# Patient Record
Sex: Female | Born: 1985 | Race: White | Hispanic: No | Marital: Married | Smoking: Current every day smoker
Health system: Southern US, Community
[De-identification: ages and names within clinical notes are randomized; demographics above are authoritative.]

## PROBLEM LIST (undated history)

## (undated) ENCOUNTER — Inpatient Hospital Stay: Payer: Self-pay

## (undated) DIAGNOSIS — F32A Depression, unspecified: Secondary | ICD-10-CM

## (undated) DIAGNOSIS — K219 Gastro-esophageal reflux disease without esophagitis: Secondary | ICD-10-CM

## (undated) DIAGNOSIS — N879 Dysplasia of cervix uteri, unspecified: Secondary | ICD-10-CM

## (undated) DIAGNOSIS — T8859XA Other complications of anesthesia, initial encounter: Secondary | ICD-10-CM

## (undated) DIAGNOSIS — T4145XA Adverse effect of unspecified anesthetic, initial encounter: Secondary | ICD-10-CM

## (undated) DIAGNOSIS — F329 Major depressive disorder, single episode, unspecified: Secondary | ICD-10-CM

## (undated) HISTORY — PX: BREAST ENHANCEMENT SURGERY: SHX7

## (undated) HISTORY — PX: LEEP: SHX91

## (undated) HISTORY — PX: DILATION AND CURETTAGE OF UTERUS: SHX78

---

## 2004-10-13 ENCOUNTER — Observation Stay: Payer: Self-pay

## 2004-10-22 ENCOUNTER — Observation Stay: Payer: Self-pay

## 2004-10-23 ENCOUNTER — Observation Stay: Payer: Self-pay

## 2004-12-21 ENCOUNTER — Inpatient Hospital Stay: Payer: Self-pay | Admitting: Unknown Physician Specialty

## 2006-01-23 ENCOUNTER — Emergency Department: Payer: Self-pay | Admitting: Emergency Medicine

## 2010-03-08 ENCOUNTER — Ambulatory Visit: Payer: Self-pay | Admitting: Family Medicine

## 2013-11-21 ENCOUNTER — Emergency Department: Payer: Self-pay | Admitting: Emergency Medicine

## 2013-11-21 LAB — CBC
HCT: 41.7 % (ref 35.0–47.0)
HGB: 14.2 g/dL (ref 12.0–16.0)
MCH: 34 pg (ref 26.0–34.0)
MCHC: 34 g/dL (ref 32.0–36.0)
MCV: 100 fL (ref 80–100)
Platelet: 301 10*3/uL (ref 150–440)
RBC: 4.17 10*6/uL (ref 3.80–5.20)
RDW: 12.8 % (ref 11.5–14.5)
WBC: 7.1 10*3/uL (ref 3.6–11.0)

## 2013-11-21 LAB — HCG, QUANTITATIVE, PREGNANCY: Beta Hcg, Quant.: 1122 m[IU]/mL — ABNORMAL HIGH

## 2014-06-10 NOTE — L&D Delivery Note (Signed)
VAGINAL DELIVERY NOTE:  Date of Delivery: 02/03/2015 Primary OB: [facility]  Gestational Age/EDD: [redacted]w[redacted]d -  3/87/5643 Antepartum complications: none Attending Physician: Barnett Applebaum, MD, FACOG Delivery Type: spontaneous vaginal delivery  Anesthesia: none Laceration: periurethral Episiotomy: none Placenta: spontaneous Intrapartum complications: None Estimated Blood Loss: 200 mL GBS: neg Procedure Details: Rapid labor after AROM.  No complications. Baby: Liveborn female, Apgars 8/9, weight 7 #, 4 oz  Barnett Applebaum, MD

## 2014-06-20 LAB — OB RESULTS CONSOLE VARICELLA ZOSTER ANTIBODY, IGG: Varicella: IMMUNE

## 2014-06-20 LAB — OB RESULTS CONSOLE ABO/RH: RH Type: POSITIVE

## 2014-06-20 LAB — OB RESULTS CONSOLE HEPATITIS B SURFACE ANTIGEN: HEP B S AG: NEGATIVE

## 2014-06-20 LAB — OB RESULTS CONSOLE RPR: RPR: NONREACTIVE

## 2014-06-20 LAB — OB RESULTS CONSOLE ANTIBODY SCREEN: Antibody Screen: NEGATIVE

## 2014-06-20 LAB — OB RESULTS CONSOLE HIV ANTIBODY (ROUTINE TESTING): HIV: NONREACTIVE

## 2014-10-04 ENCOUNTER — Ambulatory Visit (INDEPENDENT_AMBULATORY_CARE_PROVIDER_SITE_OTHER): Payer: BLUE CROSS/BLUE SHIELD | Admitting: General Surgery

## 2014-10-04 ENCOUNTER — Encounter: Payer: Self-pay | Admitting: General Surgery

## 2014-10-04 VITALS — BP 120/80 | HR 82 | Resp 14 | Ht 63.0 in | Wt 135.0 lb

## 2014-10-04 DIAGNOSIS — K645 Perianal venous thrombosis: Secondary | ICD-10-CM

## 2014-10-04 MED ORDER — HYDROCORTISONE ACE-PRAMOXINE 2.5-1 % RE CREA: 1.0000 "application " | TOPICAL_CREAM | Freq: Three times a day (TID) | RECTAL | Status: DC

## 2014-10-04 NOTE — Progress Notes (Signed)
Patient ID: Shannon Little, female   DOB: 1985/10/20, 29 y.o.   MRN: [id number]  Chief Complaint  Patient presents with  . Other    hemorrhoids    HPI Shannon Little is a 29 y.o. female here today for a evaluation of hemorrhoids. Patient first noticed the development of hemorrhoids approximately two weeks ago after a bout of constipation and diarrhea. Started having itching and pain yesterday afternoon, 2-3 hours after BM. Pain has worsened. Has tried soaking in the bath, ice, OTC hemorrhoid cream, and hemorrhoid wipes with no relief. Usually has a BM every day, not had one today due to fear of pain. Painful to sit. Denies any bleeding. No fever, abdominal pain, nausea, vomiting, or urinary symptoms.  Evaluated by PCP, believed to be 1-2 external hemorrhoids.  History of hemorrhoids with first pregnancy nine years ago. Hemorrhoids resolved on their own.  History of abnormal PAP smears. Leep procedure - patient unsure of the year, but not cancerous.   HPI  History reviewed. No pertinent past medical history.  History reviewed. No pertinent past surgical history.  History reviewed. No pertinent family history.  Social History History  Substance Use Topics  . Smoking status: Current Some Day Smoker  . Smokeless tobacco: Not on file  . Alcohol Use: No    No Known Allergies  Current Outpatient Prescriptions  Medication Sig Dispense Refill  . Prenatal Vit-Fe Fumarate-FA (PRENATAL MULTIVITAMIN) TABS tablet Take 1 tablet by mouth daily at 12 noon.    . hydrocortisone-pramoxine (ANALPRAM HC) 2.5-1 % rectal cream Place 1 application rectally 3 (three) times daily. 30 g 2   No current facility-administered medications for this visit.    Review of Systems Review of Systems  Constitutional: Negative.   Respiratory: Negative.   Cardiovascular: Negative.     Blood pressure 120/80, pulse 82, resp. rate 14, height 5\' 3"  (1.6 m), weight 61.236 kg (135 lb).  Physical Exam Physical  Exam  Constitutional: She is oriented to person, place, and time. She appears well-developed and well-nourished.  Eyes: Conjunctivae are normal. No scleral icterus.  Neck: Neck supple.  Genitourinary: Rectal exam shows external hemorrhoid.  Three external hemorrhoids 3, 7, and 8 o'clock. Anterior hemorrhoid at 8 o'clock thrombosed. All measure about 1-1.5cm.   Lymphadenopathy:    She has no cervical adenopathy.  Neurological: She is alert and oriented to person, place, and time.  Skin: Skin is warm and dry.    Data Reviewed none  Assessment    Inflamed and thrombosed external hemorrhoids.      Plan    With consent, the thrombosed hemorrhoid was opened and clots removed. 62ml 1% xyloicaine used with betadine prep.  RX with analpram cream 2.5% three times daily.  Follow up in two weeks.        SANKAR,SEEPLAPUTHUR G 10/04/2014, 3:52 PM

## 2014-10-04 NOTE — Patient Instructions (Addendum)
Use Analpram 2.5% three times daily. Follow up in two weeks.

## 2014-10-05 ENCOUNTER — Other Ambulatory Visit: Payer: Self-pay | Admitting: *Deleted

## 2014-10-05 ENCOUNTER — Telehealth: Payer: Self-pay | Admitting: *Deleted

## 2014-10-05 MED ORDER — HYDROCORTISONE ACE-PRAMOXINE 2.5-1 % EX CREA: TOPICAL_CREAM | CUTANEOUS | Status: DC

## 2014-10-05 NOTE — Telephone Encounter (Signed)
Patient called and Shannon Little was at [facility] on [address] trying to get her prescription for Analpram and Shannon Little said her insurance is not going to pay for it, so Shannon Little wants to know if Shannon Little can get something else sent to her pharmacy instead of the Analpram.

## 2014-10-18 ENCOUNTER — Ambulatory Visit (INDEPENDENT_AMBULATORY_CARE_PROVIDER_SITE_OTHER): Payer: BLUE CROSS/BLUE SHIELD | Admitting: General Surgery

## 2014-10-18 ENCOUNTER — Encounter: Payer: Self-pay | Admitting: General Surgery

## 2014-10-18 DIAGNOSIS — K648 Other hemorrhoids: Secondary | ICD-10-CM | POA: Diagnosis not present

## 2014-10-18 DIAGNOSIS — K644 Residual hemorrhoidal skin tags: Secondary | ICD-10-CM

## 2014-10-18 NOTE — Progress Notes (Signed)
Patient her for two week follow up for hemorrhoids. Patient reports she is feeling much better. She has been using maximum strength preparation H and that has been helping.  Patient did not use the Analpram her insurance did not approve and tried some of a friend's sample and irritates skin. No pain and no bleeding.  Left hemorrhoid still present, but reduced in size, no thrombosis.  Continue to use OTC cream as needed.  Follow up as needed.

## 2014-10-18 NOTE — Patient Instructions (Signed)
Continue to use OTC cream as needed. Follow up as needed.

## 2014-12-15 LAB — OB RESULTS CONSOLE GC/CHLAMYDIA
CHLAMYDIA, DNA PROBE: NEGATIVE
GC PROBE AMP, GENITAL: NEGATIVE

## 2014-12-15 LAB — OB RESULTS CONSOLE GBS: GBS: NEGATIVE

## 2015-01-24 ENCOUNTER — Observation Stay
Admission: EM | Admit: 2015-01-24 | Discharge: 2015-01-24 | Disposition: A | Discharge status: Home / Self Care | Payer: Medicaid Other | Attending: Obstetrics & Gynecology | Admitting: Obstetrics & Gynecology

## 2015-01-24 ENCOUNTER — Encounter: Payer: Self-pay | Admitting: *Deleted

## 2015-01-24 DIAGNOSIS — R109 Unspecified abdominal pain: Secondary | ICD-10-CM | POA: Diagnosis present

## 2015-01-24 DIAGNOSIS — Z3A39 39 weeks gestation of pregnancy: Secondary | ICD-10-CM | POA: Diagnosis not present

## 2015-01-24 DIAGNOSIS — O26893 Other specified pregnancy related conditions, third trimester: Secondary | ICD-10-CM | POA: Diagnosis not present

## 2015-01-24 DIAGNOSIS — Z3480 Encounter for supervision of other normal pregnancy, unspecified trimester: Secondary | ICD-10-CM

## 2015-01-24 HISTORY — DX: Dysplasia of cervix uteri, unspecified: N87.9

## 2015-01-24 MED ORDER — LACTATED RINGERS IV SOLN: 500.0000 mL | INTRAVENOUS | Status: DC | PRN

## 2015-01-24 NOTE — OB Triage Note (Signed)
Constant lower abdominal pain. Started @ 0200 this am. Awoke from sleep with pain. Membranes stripped in office yesterday. Shannon Little

## 2015-01-24 NOTE — H&P (Signed)
OB History & Physical   History of Present Illness:  Chief Complaint:   HPI:  Shannon Little is a 29 y.o. G105P1021 female with EDC=01/29/2015 at [redacted]w[redacted]d dated by LMP 04/25/2015 and 10wk6d ultrasound.  Her pregnancy has been complicated by smoking and a prior history of a LEEP..  She presents to L&D for evaluation of contractions. She denies LOF or bleeding. Contractions started at 0200 this AM. Rates her pains at 5/10. Was 2cm/80% yesterday and upon presentation this AM was 2/90%. Prenatal care site: Prenatal care at [facility] has been remarkable for early prenatal care, a normal anatomy scan, and normal first trimester and MSAFP testing. Was on Wellbutrin during her pregnancy and was able to stop smoking x1 month. She resumed smoking however. Total weight gain was 35#.      Maternal Medical History:   Past Medical History  Diagnosis Date  . Cervical dysplasia     Past Surgical History  Procedure Laterality Date  . Dilation and curettage of uterus    . Breast enhancement surgery    . Leep      No Known Allergies  Prior to Admission medications   Medication Sig Start Date End Date Taking? Authorizing Provider  Prenatal Vit-Fe Fumarate-FA (PRENATAL MULTIVITAMIN) TABS tablet Take 1 tablet by mouth daily at 12 noon.   Yes Historical Provider, MD  buPROPion (ZYBAN) 150 MG 12 hr tablet  09/17/14   Historical Provider, MD          Social History: She  reports that she has been smoking.  She has never used smokeless tobacco. She reports that she does not drink alcohol or use illicit drugs.  Family History: family history includes Cancer in her paternal grandmother.   Review of Systems: Negative x 10 systems reviewed except as noted in the HPI.      Physical Exam:  Vital Signs: BP 112/66 mmHg  Pulse 63  Temp(Src) 98.2 F (36.8 C) (Oral)  Resp 20  Ht 5\' 3"  (1.6 m)  Wt 154 lb (69.854 kg)  BMI 27.29 kg/m2 General: no acute distress.  HEENT: normocephalic,  atraumatic Heart: regular rate & rhythm.  No murmurs Lungs: clear to auscultation bilaterally Abdomen: soft, gravid, non-tender;  EFW: 7# Pelvic:   External: Normal external female genitalia  Cervix: 2.5 cm/ Effacement (%): 90 / Station: -1 with bloody show on glove  Extremities: non-tender, symmetric, trace edema bilaterally.  DTRs:+2 Neurologic: Alert & oriented x 3.    Pertinent Results:  Prenatal Labs: Blood type/Rh A positive  Antibody screen negative  Rubella Varicella Immune immune  RPR nonreactive  HBsAg negative  HIV nonreactive  GC negative  Chlamydia negative  Genetic screening Negative first trimester test/neg MSAFP  1 hour GTT 86  3 hour GTT NA  GBS negative on 01/02/15   Baseline FHR:130s with accelerations to 150s, moderate variability Toco: Contractions q2-4 min apart lasting 60 sec or longer    Assessment:  Shannon Little is a 29 y.o. G73P1021 female at [redacted]w[redacted]d in early vs latent labor FWB: Cat 1 strip  Plan:    1. Observe over the next couple hours and recheck cervix 2. GBS negative-PPX not indicated 3.    PO hydration  Jonathon Castelo  01/24/2015

## 2015-02-03 ENCOUNTER — Encounter: Payer: Self-pay | Admitting: *Deleted

## 2015-02-03 ENCOUNTER — Inpatient Hospital Stay
Admission: EM | Admit: 2015-02-03 | Discharge: 2015-02-04 | DRG: 983 | Disposition: A | Discharge status: Home / Self Care | Payer: Medicaid Other | Attending: Obstetrics & Gynecology | Admitting: Obstetrics & Gynecology

## 2015-02-03 DIAGNOSIS — Z3A40 40 weeks gestation of pregnancy: Secondary | ICD-10-CM | POA: Diagnosis present

## 2015-02-03 DIAGNOSIS — O480 Post-term pregnancy: Principal | ICD-10-CM | POA: Diagnosis present

## 2015-02-03 LAB — CBC
HCT: 37.5 % (ref 35.0–47.0)
Hemoglobin: 12.7 g/dL (ref 12.0–16.0)
MCH: 33.4 pg (ref 26.0–34.0)
MCHC: 33.8 g/dL (ref 32.0–36.0)
MCV: 98.7 fL (ref 80.0–100.0)
PLATELETS: 206 10*3/uL (ref 150–440)
RBC: 3.8 MIL/uL (ref 3.80–5.20)
RDW: 12.9 % (ref 11.5–14.5)
WBC: 14.2 10*3/uL — AB (ref 3.6–11.0)

## 2015-02-03 LAB — ABO/RH: ABO/RH(D): O POS

## 2015-02-03 LAB — TYPE AND SCREEN
ABO/RH(D): O POS
Antibody Screen: NEGATIVE

## 2015-02-03 MED ORDER — OXYTOCIN 40 UNITS IN LACTATED RINGERS INFUSION - SIMPLE MED
INTRAVENOUS | Status: AC
  Administered 2015-02-03: 999 mL/h via INTRAVENOUS
  Filled 2015-02-03: qty 1000

## 2015-02-03 MED ORDER — ONDANSETRON HCL 4 MG/2ML IJ SOLN: 4.0000 mg | INTRAMUSCULAR | Status: DC | PRN

## 2015-02-03 MED ORDER — SENNOSIDES-DOCUSATE SODIUM 8.6-50 MG PO TABS
2.0000 | ORAL_TABLET | ORAL | Status: DC
  Administered 2015-02-03: 2 via ORAL
  Filled 2015-02-03: qty 2

## 2015-02-03 MED ORDER — OXYCODONE-ACETAMINOPHEN 5-325 MG PO TABS
1.0000 | ORAL_TABLET | ORAL | Status: DC | PRN
  Administered 2015-02-03 – 2015-02-04 (×4): 1 via ORAL
  Filled 2015-02-03 (×3): qty 1

## 2015-02-03 MED ORDER — BENZOCAINE-MENTHOL 20-0.5 % EX AERO
1.0000 "application " | INHALATION_SPRAY | CUTANEOUS | Status: DC | PRN
  Filled 2015-02-03: qty 56

## 2015-02-03 MED ORDER — DIBUCAINE 1 % RE OINT
1.0000 "application " | TOPICAL_OINTMENT | RECTAL | Status: DC | PRN
  Filled 2015-02-03: qty 28

## 2015-02-03 MED ORDER — OXYCODONE-ACETAMINOPHEN 5-325 MG PO TABS
2.0000 | ORAL_TABLET | ORAL | Status: DC | PRN
  Filled 2015-02-03: qty 2

## 2015-02-03 MED ORDER — HYDROCORTISONE ACE-PRAMOXINE 1-1 % RE CREA
TOPICAL_CREAM | Freq: Four times a day (QID) | RECTAL | Status: DC | PRN
  Filled 2015-02-03: qty 30

## 2015-02-03 MED ORDER — ONDANSETRON HCL 4 MG/2ML IJ SOLN
4.0000 mg | Freq: Four times a day (QID) | INTRAMUSCULAR | Status: DC | PRN
Start: 2015-02-03 — End: 2015-02-03

## 2015-02-03 MED ORDER — ZOLPIDEM TARTRATE 5 MG PO TABS: 5.0000 mg | ORAL_TABLET | Freq: Every evening | ORAL | Status: DC | PRN

## 2015-02-03 MED ORDER — LACTATED RINGERS IV SOLN: 500.0000 mL | INTRAVENOUS | Status: DC | PRN

## 2015-02-03 MED ORDER — IBUPROFEN 600 MG PO TABS
ORAL_TABLET | ORAL | Status: AC
  Administered 2015-02-03: 600 mg via ORAL
  Filled 2015-02-03: qty 1

## 2015-02-03 MED ORDER — SIMETHICONE 80 MG PO CHEW: 80.0000 mg | CHEWABLE_TABLET | ORAL | Status: DC | PRN

## 2015-02-03 MED ORDER — OXYTOCIN BOLUS FROM INFUSION: 500.0000 mL | INTRAVENOUS | Status: DC

## 2015-02-03 MED ORDER — WITCH HAZEL-GLYCERIN EX PADS
1.0000 "application " | MEDICATED_PAD | CUTANEOUS | Status: DC | PRN
  Filled 2015-02-03: qty 100

## 2015-02-03 MED ORDER — BUTORPHANOL TARTRATE 1 MG/ML IJ SOLN
INTRAMUSCULAR | Status: AC
  Administered 2015-02-03: 1 mg via INTRAVENOUS
  Filled 2015-02-03: qty 1

## 2015-02-03 MED ORDER — OXYTOCIN 40 UNITS IN LACTATED RINGERS INFUSION - SIMPLE MED: 62.5000 mL/h | INTRAVENOUS | Status: DC | PRN

## 2015-02-03 MED ORDER — SODIUM CHLORIDE 0.9 % IJ SOLN
3.0000 mL | Freq: Two times a day (BID) | INTRAMUSCULAR | Status: DC
  Administered 2015-02-03: 3 mL via INTRAVENOUS

## 2015-02-03 MED ORDER — ONDANSETRON HCL 4 MG PO TABS: 4.0000 mg | ORAL_TABLET | ORAL | Status: DC | PRN

## 2015-02-03 MED ORDER — ACETAMINOPHEN 325 MG PO TABS: 650.0000 mg | ORAL_TABLET | ORAL | Status: DC | PRN

## 2015-02-03 MED ORDER — AMMONIA AROMATIC IN INHA
RESPIRATORY_TRACT | Status: AC
  Filled 2015-02-03: qty 10

## 2015-02-03 MED ORDER — CITRIC ACID-SODIUM CITRATE 334-500 MG/5ML PO SOLN: 30.0000 mL | ORAL | Status: DC

## 2015-02-03 MED ORDER — OXYTOCIN 10 UNIT/ML IJ SOLN
INTRAMUSCULAR | Status: AC
  Filled 2015-02-03: qty 2

## 2015-02-03 MED ORDER — SODIUM CHLORIDE 0.9 % IJ SOLN: 3.0000 mL | INTRAMUSCULAR | Status: DC | PRN

## 2015-02-03 MED ORDER — SODIUM CHLORIDE 0.9 % IV SOLN: 250.0000 mL | INTRAVENOUS | Status: DC | PRN

## 2015-02-03 MED ORDER — HYDROCORTISONE ACE-PRAMOXINE 1-1 % RE FOAM: 1.0000 | Freq: Four times a day (QID) | RECTAL | Status: DC | PRN

## 2015-02-03 MED ORDER — MISOPROSTOL 200 MCG PO TABS
ORAL_TABLET | ORAL | Status: AC
  Filled 2015-02-03: qty 4

## 2015-02-03 MED ORDER — DIPHENHYDRAMINE HCL 25 MG PO CAPS: 25.0000 mg | ORAL_CAPSULE | Freq: Four times a day (QID) | ORAL | Status: DC | PRN

## 2015-02-03 MED ORDER — LIDOCAINE HCL (PF) 1 % IJ SOLN
INTRAMUSCULAR | Status: AC
  Filled 2015-02-03: qty 30

## 2015-02-03 MED ORDER — BUTORPHANOL TARTRATE 1 MG/ML IJ SOLN
1.0000 mg | INTRAMUSCULAR | Status: DC | PRN
  Administered 2015-02-03: 1 mg via INTRAVENOUS

## 2015-02-03 MED ORDER — LACTATED RINGERS IV SOLN: INTRAVENOUS | Status: DC

## 2015-02-03 MED ORDER — TERBUTALINE SULFATE 1 MG/ML IJ SOLN: 0.2500 mg | Freq: Once | INTRAMUSCULAR | Status: DC | PRN

## 2015-02-03 MED ORDER — IBUPROFEN 600 MG PO TABS
600.0000 mg | ORAL_TABLET | Freq: Four times a day (QID) | ORAL | Status: DC
  Administered 2015-02-03 – 2015-02-04 (×4): 600 mg via ORAL
  Filled 2015-02-03 (×4): qty 1

## 2015-02-03 MED ORDER — LANOLIN HYDROUS EX OINT
TOPICAL_OINTMENT | CUTANEOUS | Status: DC | PRN
Start: 2015-02-03 — End: 2015-02-04

## 2015-02-03 MED ORDER — OXYTOCIN 40 UNITS IN LACTATED RINGERS INFUSION - SIMPLE MED
62.5000 mL/h | INTRAVENOUS | Status: DC
Start: 2015-02-03 — End: 2015-02-03
  Administered 2015-02-03: 999 mL/h via INTRAVENOUS
  Administered 2015-02-03: 62.5 mL/h via INTRAVENOUS

## 2015-02-03 NOTE — Lactation Note (Signed)
This note was copied from the chart of Shannon Beryle Bagsby. Lactation Consultation Note  Patient Name: Shannon Little [facility]'T Date: 02/03/2015 Reason for consult: Initial assessment   Maternal Data Does the patient have breastfeeding experience prior to this delivery?: No Pt attempted with 30 yr old, but did not try very long, has a breast augment with peri-areolar incision, milk never came in with 1st pregnancy, no leaking and unable to express milk from breast now, baby very fussy and roots but will not suck at breast at present, mom had Stadol in labor, mom encouraged to try again later  Feeding Feeding Type: Formula  LATCH Score/Interventions Latch: Too sleepy or reluctant, no latch achieved, no sucking elicited. Intervention(s): Skin to skin  Audible Swallowing: None  Type of Nipple: Everted at rest and after stimulation  Comfort (Breast/Nipple): Soft / non-tender     Hold (Positioning): Full assist, staff holds infant at breast  [facility] Score: 4  Lactation Tools Discussed/Used     Consult Status      Shannon Little 02/03/2015, 12:44 PM

## 2015-02-03 NOTE — Discharge Summary (Signed)
Obstetrical Discharge Summary  Date of Admission: 02/03/2015 Date of Discharge: 02/04/2015 Discharge Diagnosis: Term Pregnancy-delivered Primary OB:  [facility]   Gestational Age at Delivery: [redacted]w[redacted]d  Antepartum complications: none Date of Delivery: 02/03/15  Delivered By: Kenton Kingfisher Delivery Type: spontaneous vaginal delivery Intrapartum complications/course: None Anesthesia: none Placenta: spontaneous Laceration: periurethral Episiotomy: none Live born Female Birth Weight:  7-4 APGAR: 8, 9   Post partum course: Since the delivery, patient has tolerate activity, diet, and daily functions without difficulty or complication.  Min lochia.  No breast concerns at this time.  No signs of depression currently. BTLcanceled by patient  Postpartum Exam: General: comfortable, alert, in NAD, desires to be discharged. Tolerating regular diet and voiding without difficulty. C/o some hemorrhoidal pain. Wants to resume Wellbutrin to quit smoking. FF at U-3/ ML/ NT Lochia: appropriate Ext: No calf erythema or tenderness  Hct: 37%  Disposition: home with infant Rh Immune globulin given: not applicable Rubella vaccine given: not applicable Varicella vaccine given: not applicable Tdap vaccine given in AP or PP setting: given during prenatal care Flu vaccine given in AP or PP setting: not applicable Contraception: Depo-given script-to bring to pp visit  Prenatal Labs:O POS //Varicella Immune//Rubella Immune//RPR negative//HIV negative/HepB Surface Ag negative//plans to breastfeed  Plan:  Shannon Little was discharged to home in good condition. Follow-up appointment with [facility] provider in 6 weeks  Discharge Medications:   Medication List    TAKE these medications        benzocaine-Menthol 20-0.5 % Aero  Commonly known as:  DERMOPLAST  Apply 1 application topically as needed for irritation (perineal discomfort).     buPROPion 150 MG 12 hr tablet  Commonly known as:  ZYBAN     hydrocortisone-pramoxine 2.5-1 % rectal cream  Commonly known as:  ANALPRAM HC  Place 1 application rectally 4 (four) times daily as needed for hemorrhoids or itching.     ibuprofen 600 MG tablet  Commonly known as:  ADVIL,MOTRIN  Take 1 tablet (600 mg total) by mouth every 6 (six) hours as needed for mild pain or cramping.     lanolin Oint  Apply 1 application topically as needed (for breast care).     oxyCODONE-acetaminophen 5-325 MG per tablet  Commonly known as:  PERCOCET/ROXICET  Take 1 tablet by mouth every 4 (four) hours as needed (for pain scale 4-7).     prenatal multivitamin Tabs tablet  Take 1 tablet by mouth daily at 12 noon.        Follow-up arrangements:  6 weeks at [facility]   No future appointments.

## 2015-02-03 NOTE — Lactation Note (Signed)
This note was copied from the chart of Shannon Little. Lactation Consultation Note  Patient Name: Shannon Little OLIDC'V Date: 02/03/2015 Reason for consult: Follow-up assessment   Maternal Data  Mother has decided to formula feed, counseled but wishes to continue with formula  Feeding Feeding Type: Bottle Fed - Formula  LATCH Score/Interventions                      Lactation Tools Discussed/Used     Consult Status Consult Status: Complete (pt has decided to formula feed)    Ferol Luz 02/03/2015, 5:07 PM

## 2015-02-03 NOTE — H&P (Signed)
History and Physical Interval Note:  02/03/2015 8:49 AM  Shannon Little  has presented today for induction of labor.  See H&P.   The various methods of treatment have been discussed with the patient and family. After consideration of risks, benefits and other options for treatment, the patient has consented to  Induction.  Plans also postpartum tubal for contraception.  The patient's history has been reviewed, patient examined, no change in status, stable for labor. I have reviewed the patient's chart and labs.  Questions were answered to the patient's satisfaction.    HARRIS,ROBERT Eddie Dibbles

## 2015-02-04 ENCOUNTER — Encounter
Admission: EM | Disposition: A | Discharge status: Home / Self Care | Payer: Self-pay | Source: Home / Self Care | Attending: Obstetrics & Gynecology

## 2015-02-04 LAB — CBC
HEMATOCRIT: 37 % (ref 35.0–47.0)
Hemoglobin: 12.3 g/dL (ref 12.0–16.0)
MCH: 32.7 pg (ref 26.0–34.0)
MCHC: 33.2 g/dL (ref 32.0–36.0)
MCV: 98.4 fL (ref 80.0–100.0)
Platelets: 206 10*3/uL (ref 150–440)
RBC: 3.76 MIL/uL — ABNORMAL LOW (ref 3.80–5.20)
RDW: 13.1 % (ref 11.5–14.5)
WBC: 12.6 10*3/uL — ABNORMAL HIGH (ref 3.6–11.0)

## 2015-02-04 LAB — RPR: RPR: NONREACTIVE

## 2015-02-04 SURGERY — LIGATION, FALLOPIAN TUBE, POSTPARTUM
Anesthesia: Choice | Laterality: Bilateral

## 2015-02-04 MED ORDER — IBUPROFEN 600 MG PO TABS: 600.0000 mg | ORAL_TABLET | Freq: Four times a day (QID) | ORAL | Status: DC | PRN

## 2015-02-04 MED ORDER — HYDROCORTISONE ACE-PRAMOXINE 2.5-1 % RE CREA: 1.0000 "application " | TOPICAL_CREAM | Freq: Four times a day (QID) | RECTAL | Status: DC | PRN

## 2015-02-04 MED ORDER — LANOLIN HYDROUS EX OINT: 1.0000 "application " | TOPICAL_OINTMENT | CUTANEOUS | Status: DC | PRN

## 2015-02-04 MED ORDER — OXYCODONE-ACETAMINOPHEN 5-325 MG PO TABS: 1.0000 | ORAL_TABLET | ORAL | Status: DC | PRN

## 2015-02-04 MED ORDER — BENZOCAINE-MENTHOL 20-0.5 % EX AERO: 1.0000 "application " | INHALATION_SPRAY | CUTANEOUS | Status: DC | PRN

## 2015-02-04 NOTE — Discharge Instructions (Signed)
Vaginal Delivery, Care After Refer to this sheet in the next few weeks. These discharge instructions provide you with information on caring for yourself after delivery. Your caregiver may also give you specific instructions. Your treatment has been planned according to the most current medical practices available, but problems sometimes occur. Call your caregiver if you have any problems or questions after you go home. HOME CARE INSTRUCTIONS  Take over-the-counter or prescription medicines only as directed by your caregiver or pharmacist.  Do not drink alcohol, especially if you are breastfeeding or taking medicine to relieve pain.  Do not chew or smoke tobacco around the baby. Take the Wellbutrin for 2 weeks before quit date.  Do not use illegal drugs.  Continue to use good perineal care. Good perineal care includes:  Wiping your perineum from front to back.  Keeping your perineum clean.  Do not use tampons, douche, or have sexual intercourse for the next 6 weeks.   Showers only, no tub baths for the next 6 weeks.   Wear a well-fitting bra that provides breast support.  Eat healthy foods.  Drink enough fluids to keep your urine clear or pale yellow.  Eat high-fiber foods such as whole grain cereals and breads, brown rice, beans, and fresh fruits and vegetables every day. These foods may help prevent or relieve constipation.  Follow your caregiver's recommendations regarding resumption of activities such as climbing stairs, driving, lifting, exercising, or traveling.  Try to have someone help you with your household activities and your newborn for at least a few days after you leave the hospital.  Rest as much as possible. Try to rest or take a nap when your newborn is sleeping.  Increase your activities gradually.  Keep all of your scheduled postpartum appointments. It is very important to keep your scheduled follow-up appointments. At these appointments, your caregiver will be  checking to make sure that you are healing physically and emotionally. SEEK MEDICAL CARE IF:   You are passing large clots from your vagina. Save any clots to show your caregiver.  You have a foul smelling discharge from your vagina.  You have trouble urinating.  You are urinating frequently.  You have pain when you urinate.  You have a change in your bowel movements.  You have increasing redness, pain, or swelling near your vaginal incision (episiotomy) or vaginal tear.  You have pus draining from your episiotomy or vaginal tear.  Your episiotomy or vaginal tear is separating.  You have painful, hard, or reddened breasts.  You have a severe headache.  You have blurred vision or see spots.  You feel sad or depressed.  You have thoughts of hurting yourself or your newborn.  You have questions about your care, the care of your newborn, or medicines.  You are dizzy or light-headed.  You have a rash.  You have nausea or vomiting.  You were breastfeeding and have not had a menstrual period within 12 weeks after you stopped breastfeeding.  You are not breastfeeding and have not had a menstrual period by the 12th week after delivery.  You have a fever. SEEK IMMEDIATE MEDICAL CARE IF:   You have persistent pain.  You have chest pain.  You have shortness of breath.  You faint.  You have leg pain.  You have stomach pain.  Your vaginal bleeding saturates two or more sanitary pads in 1 hour. MAKE SURE YOU:   Understand these instructions.  Will watch your condition.  Will get help right  away if you are not doing well or get worse.  This information is not intended to replace advice given to you by your health care provider. Make sure you discuss any questions you have with your health care provider.

## 2015-02-04 NOTE — Progress Notes (Signed)
Patient discharged home with infant. Vital signs stable, bleeding within normal limits, uterus firm. Discharge instructions, prescriptions, and follow up appointment given to and reviewed with patient. Patient verbalized understanding, all questions answered. Escorted in wheelchair by nursing.

## 2015-02-21 NOTE — Progress Notes (Signed)
L&D Triage Note  S: Contractions have spaced out, not as strong O: FHR: 135 with accels to 160, moderate variability Toco: irregular contractions Cervix: no change over 4-5 hours  A: Prodromal labor/ False labor  P: DC home with labor precations FU as scheduled at the office.  Dalia Heading, CNM

## 2015-04-07 LAB — HM PAP SMEAR: HM Pap smear: NEGATIVE

## 2016-06-10 NOTE — L&D Delivery Note (Signed)
Delivery Note At 6:19 AM a viable female was delivered via  (Presentation: OA).  APGAR: 8, 9; weight 5 lb 7.8 oz (2490 g).   Placenta status: spontaneous, intact.  Cord:3VC  with the following complications: precipitous delivert.  Cord pH: N/A  Anesthesia:  none Episiotomy:  none Lacerations:  none Suture Repair: none Est. Blood Loss (mL):  237mL  Mom to postpartum.  Baby to Couplet care / Skin to Skin.  Shannon Little 10/31/2016, 6:47 PM

## 2016-07-04 ENCOUNTER — Emergency Department
Admission: EM | Admit: 2016-07-04 | Discharge: 2016-07-04 | Disposition: A | Discharge status: Home / Self Care | Payer: BLUE CROSS/BLUE SHIELD | Attending: Emergency Medicine | Admitting: Emergency Medicine

## 2016-07-04 ENCOUNTER — Encounter: Payer: Self-pay | Admitting: Emergency Medicine

## 2016-07-04 ENCOUNTER — Emergency Department: Payer: BLUE CROSS/BLUE SHIELD

## 2016-07-04 DIAGNOSIS — O26899 Other specified pregnancy related conditions, unspecified trimester: Secondary | ICD-10-CM

## 2016-07-04 DIAGNOSIS — Z3A21 21 weeks gestation of pregnancy: Secondary | ICD-10-CM | POA: Insufficient documentation

## 2016-07-04 DIAGNOSIS — R102 Pelvic and perineal pain: Secondary | ICD-10-CM | POA: Diagnosis not present

## 2016-07-04 DIAGNOSIS — R109 Unspecified abdominal pain: Secondary | ICD-10-CM

## 2016-07-04 DIAGNOSIS — Z3492 Encounter for supervision of normal pregnancy, unspecified, second trimester: Secondary | ICD-10-CM

## 2016-07-04 DIAGNOSIS — R1032 Left lower quadrant pain: Secondary | ICD-10-CM | POA: Diagnosis not present

## 2016-07-04 DIAGNOSIS — F17200 Nicotine dependence, unspecified, uncomplicated: Secondary | ICD-10-CM | POA: Insufficient documentation

## 2016-07-04 DIAGNOSIS — O99332 Smoking (tobacco) complicating pregnancy, second trimester: Secondary | ICD-10-CM | POA: Diagnosis not present

## 2016-07-04 DIAGNOSIS — O26892 Other specified pregnancy related conditions, second trimester: Secondary | ICD-10-CM | POA: Insufficient documentation

## 2016-07-04 LAB — CBC WITH DIFFERENTIAL/PLATELET
BASOS ABS: 0.1 10*3/uL (ref 0–0.1)
Basophils Relative: 1 %
EOS PCT: 3 %
Eosinophils Absolute: 0.3 10*3/uL (ref 0–0.7)
HEMATOCRIT: 35.5 % (ref 35.0–47.0)
HEMOGLOBIN: 12.5 g/dL (ref 12.0–16.0)
LYMPHS PCT: 24 %
Lymphs Abs: 2.6 10*3/uL (ref 1.0–3.6)
MCH: 34.3 pg — AB (ref 26.0–34.0)
MCHC: 35.1 g/dL (ref 32.0–36.0)
MCV: 97.7 fL (ref 80.0–100.0)
Monocytes Absolute: 0.6 10*3/uL (ref 0.2–0.9)
Monocytes Relative: 6 %
NEUTROS ABS: 7 10*3/uL — AB (ref 1.4–6.5)
Neutrophils Relative %: 66 %
Platelets: 396 10*3/uL (ref 150–440)
RBC: 3.64 MIL/uL — AB (ref 3.80–5.20)
RDW: 13.1 % (ref 11.5–14.5)
WBC: 10.6 10*3/uL (ref 3.6–11.0)

## 2016-07-04 LAB — URINALYSIS, COMPLETE (UACMP) WITH MICROSCOPIC
BACTERIA UA: NONE SEEN
Bilirubin Urine: NEGATIVE
Glucose, UA: NEGATIVE mg/dL
HGB URINE DIPSTICK: NEGATIVE
Ketones, ur: 5 mg/dL — AB
Leukocytes, UA: NEGATIVE
NITRITE: NEGATIVE
PROTEIN: NEGATIVE mg/dL
SPECIFIC GRAVITY, URINE: 1.018 (ref 1.005–1.030)
pH: 6 (ref 5.0–8.0)

## 2016-07-04 LAB — BASIC METABOLIC PANEL
Anion gap: 5 (ref 5–15)
CALCIUM: 8.2 mg/dL — AB (ref 8.9–10.3)
CHLORIDE: 107 mmol/L (ref 101–111)
CO2: 24 mmol/L (ref 22–32)
CREATININE: 0.51 mg/dL (ref 0.44–1.00)
GFR calc non Af Amer: >60 mL/min (ref >60)
Glucose, Bld: 89 mg/dL (ref 65–99)
Potassium: 3.6 mmol/L (ref 3.5–5.1)
SODIUM: 136 mmol/L (ref 135–145)

## 2016-07-04 LAB — ABO/RH: ABO/RH(D): O POS

## 2016-07-04 LAB — HCG, QUANTITATIVE, PREGNANCY: hCG, Beta Chain, Quant, S: 19423 m[IU]/mL — ABNORMAL HIGH (ref <5)

## 2016-07-04 NOTE — ED Notes (Signed)
Pt states abd pain and increase in discharge, states she believes is 5 months pregnant but has not had any prenatal care, states she can feel the baby kicking, 3rd child, denies any vaginal bleeding, awake and alert in no acute distress

## 2016-07-04 NOTE — Discharge Instructions (Signed)
Your workup was reassuring today with no sign of infection or lab abnormalities.  Your ultrasound showed a single normal pregnancy at 21 weeks, 1 day gestation.  Please continue to take prenatal vitamins and follow-up with an OB provider of your choice at the next available opportunity.   Return to the emergency department if you develop new or worsening symptoms that concern you.

## 2016-07-04 NOTE — ED Triage Notes (Signed)
Patient to ER for c/o left pelvic pain. Patient states pain is sharp in nature, feels like pain she felt with previous miscarriage. Patient is currently pregnant, believes she is approx 5 months pregnant, but has not received any prenatal care. Has appointment with [facility] for 2/13 for first visit. Patient reports heavy vaginal discharge that is normal white color. Denies any vaginal bleeding or fluid leaking. Denies any fevers or diarrhea. Denies urinary symptoms. States pains do not feel like contractions.

## 2016-07-04 NOTE — ED Provider Notes (Signed)
[facility] Emergency Department Provider Note  ____________________________________________   None    (approximate)  I have reviewed the triage vital signs and the nursing notes.   HISTORY  Chief Complaint Abdominal Pain    HPI Shannon Little is a 31 y.o. female G5 P2 Ab2 at an estimated 5 months gestation by dates who has not had any prenatal care who presents for evaluation of abdominal pain.  She states that she has intermittent left-sided sharp pelvic pain and she is concerned because it feels like the pain she had with a previous miscarriage. She describes them pain as moderate in intensity but intermittent.  She has had no vaginal bleeding and normal whitish vaginal discharge.  She denies dysuria.  She also denies fever/chills, chest pain, shortness of breath, nausea, vomiting,  Diarrhea, constipation.  She has not yet followed up with OB because she did not have insurance but her insurance is now establishing she has an appointment with [facility] within the next couple of weeks.    Past Medical History:  Diagnosis Date  . Cervical dysplasia     Patient Active Problem List   Diagnosis Date Noted  . Post-dates pregnancy 02/03/2015  . Postpartum care following vaginal delivery 02/03/2015  . Pregnancy 01/24/2015  . Indication for care in labor or delivery 01/24/2015    Past Surgical History:  Procedure Laterality Date  . BREAST ENHANCEMENT SURGERY    . DILATION AND CURETTAGE OF UTERUS    . LEEP      Prior to Admission medications   Medication Sig Start Date End Date Taking? Authorizing Provider  benzocaine-Menthol (DERMOPLAST) 20-0.5 % AERO Apply 1 application topically as needed for irritation (perineal discomfort). 02/04/15   Dalia Heading, CNM  buPROPion (ZYBAN) 150 MG 12 hr tablet  09/17/14   Historical Provider, MD  hydrocortisone-pramoxine (ANALPRAM HC) 2.5-1 % rectal cream Place 1 application rectally 4 (four) times daily as needed  for hemorrhoids or itching. 02/04/15   Dalia Heading, CNM  ibuprofen (ADVIL,MOTRIN) 600 MG tablet Take 1 tablet (600 mg total) by mouth every 6 (six) hours as needed for mild pain or cramping. 02/04/15   Dalia Heading, CNM  lanolin OINT Apply 1 application topically as needed (for breast care). 02/04/15   Dalia Heading, CNM  oxyCODONE-acetaminophen (PERCOCET/ROXICET) 5-325 MG per tablet Take 1 tablet by mouth every 4 (four) hours as needed (for pain scale 4-7). 02/04/15   Dalia Heading, CNM  Prenatal Vit-Fe Fumarate-FA (PRENATAL MULTIVITAMIN) TABS tablet Take 1 tablet by mouth daily at 12 noon.    Historical Provider, MD    Allergies Patient has no known allergies.  Family History  Problem Relation Age of Onset  . Cancer Paternal Grandmother     ovarian    Social History Social History  Substance Use Topics  . Smoking status: Current Every Day Smoker    Packs/day: 1.00    Years: 13.00  . Smokeless tobacco: Never Used  . Alcohol use No    Review of Systems Constitutional: No fever/chills Eyes: No visual changes. ENT: No sore throat. Cardiovascular: Denies chest pain. Respiratory: Denies shortness of breath. Gastrointestinal: Intermittent left-sided pelvic/abdominal pain.  No nausea, no vomiting.  No diarrhea.  No constipation. Genitourinary: Negative for dysuria.  No vaginal bleeding Musculoskeletal: Negative for back pain. Skin: Negative for rash. Neurological: Negative for headaches, focal weakness or numbness.  10-point ROS otherwise negative.  ____________________________________________   PHYSICAL EXAM:  VITAL SIGNS: ED Triage Vitals  Enc Vitals Group  BP 07/04/16 1448 120/69     Pulse Rate 07/04/16 1448 93     Resp 07/04/16 1448 18     Temp 07/04/16 1448 98 F (36.7 C)     Temp Source 07/04/16 1448 Oral     SpO2 07/04/16 1448 99 %     Weight 07/04/16 1449 120 lb (54.4 kg)     Height 07/04/16 1449 5\' 3"  (1.6 m)     Head Circumference --       Peak Flow --      Pain Score 07/04/16 1448 2     Pain Loc --      Pain Edu? --      Excl. in [place]? --     Constitutional: Alert and oriented. Well appearing and in no acute distress. Eyes: Conjunctivae are normal. PERRL. EOMI. Head: Atraumatic. Nose: No congestion/rhinnorhea. Mouth/Throat: Mucous membranes are moist.  Oropharynx non-erythematous. Neck: No stridor.  No meningeal signs.   Cardiovascular: Normal rate, regular rhythm. Good peripheral circulation. Grossly normal heart sounds. Respiratory: Normal respiratory effort.  No retractions. Lungs CTAB. Gastrointestinal: Soft and nontender.  Gravid uterus consistent with dates.  No distention.  Genitourinary: Deferred due to second trimester pregnancy and no indication for urgent examination such as vaginal bleeding Musculoskeletal: No lower extremity tenderness nor edema. No gross deformities of extremities. Neurologic:  Normal speech and language. No gross focal neurologic deficits are appreciated.  Skin:  Skin is warm, dry and intact. No rash noted. Psychiatric: Mood and affect are normal. Speech and behavior are normal.  ____________________________________________   LABS (all labs ordered are listed, but only abnormal results are displayed)  Labs Reviewed  HCG, QUANTITATIVE, PREGNANCY - Abnormal; Notable for the following:       Result Value   hCG, Beta Chain, Quant, S [id number] (*)    All other components within normal limits  CBC WITH DIFFERENTIAL/PLATELET - Abnormal; Notable for the following:    RBC 3.64 (*)    MCH 34.3 (*)    Neutro Abs 7.0 (*)    All other components within normal limits  BASIC METABOLIC PANEL - Abnormal; Notable for the following:    BUN <5 (*)    Calcium 8.2 (*)    All other components within normal limits  URINALYSIS, COMPLETE (UACMP) WITH MICROSCOPIC - Abnormal; Notable for the following:    Color, Urine YELLOW (*)    APPearance CLEAR (*)    Ketones, ur 5 (*)    Squamous Epithelial / LPF 0-5  (*)    All other components within normal limits  ABO/RH   ____________________________________________  EKG  None - EKG not ordered by ED physician ____________________________________________  RADIOLOGY   US Ob Limited > 14 Wks  Result Date: 07/04/2016 CLINICAL DATA:  Abdominal pain and left lower quadrant pain over the last 2-3 days. EXAM: LIMITED OBSTETRIC ULTRASOUND FINDINGS: Number of Fetuses: 1 Heart Rate:  147 bpm Movement: Present Presentation: Cephalic Placental Location: Anterior Previa: No.  No abnormal finding on this limited exam. Amniotic Fluid (Subjective):  Within normal limits. BPD:  5.0cm 21w  1d MATERNAL FINDINGS: Cervix:  Appears closed. Uterus/Adnexae:  No abnormality visualized. IMPRESSION: Single living intrauterine pregnancy at 21 weeks 1 day. No abnormal finding on this limited exam. This exam is performed on an emergent basis and does not comprehensively evaluate fetal size, dating, or anatomy; follow-up complete OB US should be considered if further fetal assessment is warranted. Electronically Signed   By: Jan Fireman.D.  On: 07/04/2016 18:07    ____________________________________________   PROCEDURES  Procedure(s) performed:   Procedures   Critical Care performed: No ____________________________________________   INITIAL IMPRESSION / ASSESSMENT AND PLAN / ED COURSE  Pertinent labs & imaging results that were available during my care of the patient were reviewed by me and considered in my medical decision making (see chart for details).  Well-appearing, no acute distress, normal vital signs.  She states she just needed reassurance because of her prior to miscarriages.  She states she is taking prenatal vitamins and will continue to do so until she follows up with OB.  I explained that I am going to defer the pelvic exam because she is in her second trimester which increases the risk of the exam and it is not necessary at this time.  She states  she is frustrated because she was not able to get appointment right away with [facility] after she called them, so at her request I provided additional contact information but she does plan on continuing with [facility] at this time.    I gave my usual and customary return precautions.         ____________________________________________  FINAL CLINICAL IMPRESSION(S) / ED DIAGNOSES  Final diagnoses:  Abdominal pain during pregnancy  Second trimester pregnancy     MEDICATIONS GIVEN DURING THIS VISIT:  Medications - No data to display   NEW OUTPATIENT MEDICATIONS STARTED DURING THIS VISIT:  New Prescriptions   No medications on file    Modified Medications   No medications on file    Discontinued Medications   No medications on file     Note:  This document was prepared using Dragon voice recognition software and may include unintentional dictation errors.    Hinda Kehr, MD 07/04/16 [phone]

## 2016-07-23 DIAGNOSIS — O0932 Supervision of pregnancy with insufficient antenatal care, second trimester: Secondary | ICD-10-CM | POA: Diagnosis not present

## 2016-07-23 DIAGNOSIS — Z3492 Encounter for supervision of normal pregnancy, unspecified, second trimester: Secondary | ICD-10-CM | POA: Diagnosis not present

## 2016-07-23 LAB — OB RESULTS CONSOLE HGB/HCT, BLOOD
HCT: 35 %
HEMOGLOBIN: 12.1 g/dL

## 2016-07-23 LAB — OB RESULTS CONSOLE ANTIBODY SCREEN: Antibody Screen: NEGATIVE

## 2016-07-23 LAB — OB RESULTS CONSOLE RUBELLA ANTIBODY, IGM: RUBELLA: IMMUNE

## 2016-07-23 LAB — OB RESULTS CONSOLE HEPATITIS B SURFACE ANTIGEN: Hepatitis B Surface Ag: NEGATIVE

## 2016-07-23 LAB — OB RESULTS CONSOLE HIV ANTIBODY (ROUTINE TESTING): HIV: NONREACTIVE

## 2016-07-23 LAB — OB RESULTS CONSOLE PLATELET COUNT: Platelets: 362 10*3/uL

## 2016-07-23 LAB — OB RESULTS CONSOLE GC/CHLAMYDIA
Chlamydia: NEGATIVE
Gonorrhea: NEGATIVE

## 2016-07-23 LAB — OB RESULTS CONSOLE ABO/RH: RH TYPE: POSITIVE

## 2016-07-23 LAB — OB RESULTS CONSOLE RPR: RPR: NONREACTIVE

## 2016-07-23 LAB — OB RESULTS CONSOLE VARICELLA ZOSTER ANTIBODY, IGG: Varicella: IMMUNE

## 2016-08-23 ENCOUNTER — Encounter: Payer: Self-pay | Admitting: Advanced Practice Midwife

## 2016-08-23 ENCOUNTER — Other Ambulatory Visit: Payer: BLUE CROSS/BLUE SHIELD

## 2016-08-23 ENCOUNTER — Ambulatory Visit (INDEPENDENT_AMBULATORY_CARE_PROVIDER_SITE_OTHER): Payer: BLUE CROSS/BLUE SHIELD | Admitting: Advanced Practice Midwife

## 2016-08-23 VITALS — BP 100/54 | HR 89 | Wt 132.0 lb

## 2016-08-23 DIAGNOSIS — Z3480 Encounter for supervision of other normal pregnancy, unspecified trimester: Secondary | ICD-10-CM

## 2016-08-23 DIAGNOSIS — Z113 Encounter for screening for infections with a predominantly sexual mode of transmission: Secondary | ICD-10-CM

## 2016-08-23 DIAGNOSIS — Z130 Encounter for screening for diseases of the blood and blood-forming organs and certain disorders involving the immune mechanism: Secondary | ICD-10-CM

## 2016-08-23 DIAGNOSIS — Z3A28 28 weeks gestation of pregnancy: Secondary | ICD-10-CM | POA: Diagnosis not present

## 2016-08-23 DIAGNOSIS — Z362 Encounter for other antenatal screening follow-up: Secondary | ICD-10-CM

## 2016-08-23 DIAGNOSIS — Z131 Encounter for screening for diabetes mellitus: Secondary | ICD-10-CM | POA: Diagnosis not present

## 2016-08-23 DIAGNOSIS — IMO0002 Reserved for concepts with insufficient information to code with codable children: Secondary | ICD-10-CM

## 2016-08-23 DIAGNOSIS — Z0489 Encounter for examination and observation for other specified reasons: Secondary | ICD-10-CM

## 2016-08-23 NOTE — Progress Notes (Signed)
Heartburn/GTT today

## 2016-08-23 NOTE — Progress Notes (Addendum)
Feeling tired today from work and little sleep. Otherwise doing well. 28 week labs today. Had one tooth repaired and will wait until after delivery to have the other tooth repaired.  f/u u/s today for incomplete anatomy from last visit 3 weeks ago now complete for cardiac. Reviewed comfort measures for heartburn

## 2016-08-23 NOTE — Addendum Note (Signed)
Addended by: Rod Can on: 08/23/2016 04:01 PM   Modules accepted: Orders

## 2016-08-23 NOTE — Patient Instructions (Signed)
Third Trimester of Pregnancy The third trimester is from week 28 through week 40 (months 7 through 9). The third trimester is a time when the unborn baby (fetus) is growing rapidly. At the end of the ninth month, the fetus is about 20 inches in length and weighs 6-10 pounds. Body changes during your third trimester Your body will continue to go through many changes during pregnancy. The changes vary from woman to woman. During the third trimester:  Your weight will continue to increase. You can expect to gain 25-35 pounds (11-16 kg) by the end of the pregnancy.  You may begin to get stretch marks on your hips, abdomen, and breasts.  You may urinate more often because the fetus is moving lower into your pelvis and pressing on your bladder.  You may develop or continue to have heartburn. This is caused by increased hormones that slow down muscles in the digestive tract.  You may develop or continue to have constipation because increased hormones slow digestion and cause the muscles that push waste through your intestines to relax.  You may develop hemorrhoids. These are swollen veins (varicose veins) in the rectum that can itch or be painful.  You may develop swollen, bulging veins (varicose veins) in your legs.  You may have increased body aches in the pelvis, back, or thighs. This is due to weight gain and increased hormones that are relaxing your joints.  You may have changes in your hair. These can include thickening of your hair, rapid growth, and changes in texture. Some women also have hair loss during or after pregnancy, or hair that feels dry or thin. Your hair will most likely return to normal after your baby is born.  Your breasts will continue to grow and they will continue to become tender. A yellow fluid (colostrum) may leak from your breasts. This is the first milk you are producing for your baby.  Your belly button may stick out.  You may notice more swelling in your hands,  face, or ankles.  You may have increased tingling or numbness in your hands, arms, and legs. The skin on your belly may also feel numb.  You may feel short of breath because of your expanding uterus.  You may have more problems sleeping. This can be caused by the size of your belly, increased need to urinate, and an increase in your body's metabolism.  You may notice the fetus "dropping," or moving lower in your abdomen (lightening).  You may have increased vaginal discharge.  You may notice your joints feel loose and you may have pain around your pelvic bone.  What to expect at prenatal visits You will have prenatal exams every 2 weeks until week 36. Then you will have weekly prenatal exams. During a routine prenatal visit:  You will be weighed to make sure you and the baby are growing normally.  Your blood pressure will be taken.  Your abdomen will be measured to track your baby's growth.  The fetal heartbeat will be listened to.  Any test results from the previous visit will be discussed.  You may have a cervical check near your due date to see if your cervix has softened or thinned (effaced).  You will be tested for Group B streptococcus. This happens between 35 and 37 weeks.  Your health care provider may ask you:  What your birth plan is.  How you are feeling.  If you are feeling the baby move.  If you have had   any abnormal symptoms, such as leaking fluid, bleeding, severe headaches, or abdominal cramping.  If you are using any tobacco products, including cigarettes, chewing tobacco, and electronic cigarettes.  If you have any questions.  Other tests or screenings that may be performed during your third trimester include:  Blood tests that check for low iron levels (anemia).  Fetal testing to check the health, activity level, and growth of the fetus. Testing is done if you have certain medical conditions or if there are problems during the  pregnancy.  Nonstress test (NST). This test checks the health of your baby to make sure there are no signs of problems, such as the baby not getting enough oxygen. During this test, a belt is placed around your belly. The baby is made to move, and its heart rate is monitored during movement.  What is false labor? False labor is a condition in which you feel small, irregular tightenings of the muscles in the womb (contractions) that usually go away with rest, changing position, or drinking water. These are called Braxton Hicks contractions. Contractions may last for hours, days, or even weeks before true labor sets in. If contractions come at regular intervals, become more frequent, increase in intensity, or become painful, you should see your health care provider. What are the signs of labor?  Abdominal cramps.  Regular contractions that start at 10 minutes apart and become stronger and more frequent with time.  Contractions that start on the top of the uterus and spread down to the lower abdomen and back.  Increased pelvic pressure and dull back pain.  A watery or bloody mucus discharge that comes from the vagina.  Leaking of amniotic fluid. This is also known as your "water breaking." It could be a slow trickle or a gush. Let your health care provider know if it has a color or strange odor. If you have any of these signs, call your health care provider right away, even if it is before your due date. Follow these instructions at home: Medicines  Follow your health care provider's instructions regarding medicine use. Specific medicines may be either safe or unsafe to take during pregnancy.  Take a prenatal vitamin that contains at least 600 micrograms (mcg) of folic acid.  If you develop constipation, try taking a stool softener if your health care provider approves. Eating and drinking  Eat a balanced diet that includes fresh fruits and vegetables, whole grains, good sources of protein  such as meat, eggs, or tofu, and low-fat dairy. Your health care provider will help you determine the amount of weight gain that is right for you.  Avoid raw meat and uncooked cheese. These carry germs that can cause birth defects in the baby.  If you have low calcium intake from food, talk to your health care provider about whether you should take a daily calcium supplement.  Eat four or five small meals rather than three large meals a day.  Limit foods that are high in fat and processed sugars, such as fried and sweet foods.  To prevent constipation: ? Drink enough fluid to keep your urine clear or pale yellow. ? Eat foods that are high in fiber, such as fresh fruits and vegetables, whole grains, and beans. Activity  Exercise only as directed by your health care provider. Most women can continue their usual exercise routine during pregnancy. Try to exercise for 30 minutes at least 5 days a week. Stop exercising if you experience uterine contractions.  Avoid heavy   lifting.  Do not exercise in extreme heat or humidity, or at high altitudes.  Wear low-heel, comfortable shoes.  Practice good posture.  You may continue to have sex unless your health care provider tells you otherwise. Relieving pain and discomfort  Take frequent breaks and rest with your legs elevated if you have leg cramps or low back pain.  Take warm sitz baths to soothe any pain or discomfort caused by hemorrhoids. Use hemorrhoid cream if your health care provider approves.  Wear a good support bra to prevent discomfort from breast tenderness.  If you develop varicose veins: ? Wear support pantyhose or compression stockings as told by your healthcare provider. ? Elevate your feet for 15 minutes, 3-4 times a day. Prenatal care  Write down your questions. Take them to your prenatal visits.  Keep all your prenatal visits as told by your health care provider. This is important. Safety  Wear your seat belt at  all times when driving.  Make a list of emergency phone numbers, including numbers for family, friends, the hospital, and police and fire departments. General instructions  Avoid cat litter boxes and soil used by cats. These carry germs that can cause birth defects in the baby. If you have a cat, ask someone to clean the litter box for you.  Do not travel far distances unless it is absolutely necessary and only with the approval of your health care provider.  Do not use hot tubs, steam rooms, or saunas.  Do not drink alcohol.  Do not use any products that contain nicotine or tobacco, such as cigarettes and e-cigarettes. If you need help quitting, ask your health care provider.  Do not use any medicinal herbs or unprescribed drugs. These chemicals affect the formation and growth of the baby.  Do not douche or use tampons or scented sanitary pads.  Do not cross your legs for long periods of time.  To prepare for the arrival of your baby: ? Take prenatal classes to understand, practice, and ask questions about labor and delivery. ? Make a trial run to the hospital. ? Visit the hospital and tour the maternity area. ? Arrange for maternity or paternity leave through employers. ? Arrange for family and friends to take care of pets while you are in the hospital. ? Purchase a rear-facing car seat and make sure you know how to install it in your car. ? Pack your hospital bag. ? Prepare the baby's nursery. Make sure to remove all pillows and stuffed animals from the baby's crib to prevent suffocation.  Visit your dentist if you have not gone during your pregnancy. Use a soft toothbrush to brush your teeth and be gentle when you floss. Contact a health care provider if:  You are unsure if you are in labor or if your water has broken.  You become dizzy.  You have mild pelvic cramps, pelvic pressure, or nagging pain in your abdominal area.  You have lower back pain.  You have persistent  nausea, vomiting, or diarrhea.  You have an unusual or bad smelling vaginal discharge.  You have pain when you urinate. Get help right away if:  Your water breaks before 37 weeks.  You have regular contractions less than 5 minutes apart before 37 weeks.  You have a fever.  You are leaking fluid from your vagina.  You have spotting or bleeding from your vagina.  You have severe abdominal pain or cramping.  You have rapid weight loss or weight gain.    You have shortness of breath with chest pain.  You notice sudden or extreme swelling of your face, hands, ankles, feet, or legs.  Your baby makes fewer than 10 movements in 2 hours.  You have severe headaches that do not go away when you take medicine.  You have vision changes. Summary  The third trimester is from week 28 through week 40, months 7 through 9. The third trimester is a time when the unborn baby (fetus) is growing rapidly.  During the third trimester, your discomfort may increase as you and your baby continue to gain weight. You may have abdominal, leg, and back pain, sleeping problems, and an increased need to urinate.  During the third trimester your breasts will keep growing and they will continue to become tender. A yellow fluid (colostrum) may leak from your breasts. This is the first milk you are producing for your baby.  False labor is a condition in which you feel small, irregular tightenings of the muscles in the womb (contractions) that eventually go away. These are called Braxton Hicks contractions. Contractions may last for hours, days, or even weeks before true labor sets in.  Signs of labor can include: abdominal cramps; regular contractions that start at 10 minutes apart and become stronger and more frequent with time; watery or bloody mucus discharge that comes from the vagina; increased pelvic pressure and dull back pain; and leaking of amniotic fluid. This information is not intended to replace advice  given to you by your health care provider. Make sure you discuss any questions you have with your health care provider. Document Released: 05/21/2001 Document Revised: 11/02/2015 Document Reviewed: 07/28/2012 Elsevier Interactive Patient Education  2017 [employer]  

## 2016-08-24 LAB — 28 WEEK RH+PANEL
BASOS ABS: 0.1 10*3/uL (ref 0.0–0.2)
Basos: 1 %
EOS (ABSOLUTE): 0.3 10*3/uL (ref 0.0–0.4)
EOS: 3 %
Gestational Diabetes Screen: 99 mg/dL (ref 65–139)
HIV SCREEN 4TH GENERATION: NONREACTIVE
Hematocrit: 33.8 % — ABNORMAL LOW (ref 34.0–46.6)
Hemoglobin: 11.5 g/dL (ref 11.1–15.9)
IMMATURE GRANS (ABS): 0.1 10*3/uL (ref 0.0–0.1)
IMMATURE GRANULOCYTES: 1 %
LYMPHS: 27 %
Lymphocytes Absolute: 2.7 10*3/uL (ref 0.7–3.1)
MCH: 32.5 pg (ref 26.6–33.0)
MCHC: 34 g/dL (ref 31.5–35.7)
MCV: 96 fL (ref 79–97)
MONOCYTES: 5 %
Monocytes Absolute: 0.5 10*3/uL (ref 0.1–0.9)
NEUTROS PCT: 63 %
Neutrophils Absolute: 6.5 10*3/uL (ref 1.4–7.0)
PLATELETS: 370 10*3/uL (ref 150–379)
RBC: 3.54 x10E6/uL — ABNORMAL LOW (ref 3.77–5.28)
RDW: 12.9 % (ref 12.3–15.4)
RPR: NONREACTIVE
WBC: 10.1 10*3/uL (ref 3.4–10.8)

## 2016-09-09 ENCOUNTER — Ambulatory Visit (INDEPENDENT_AMBULATORY_CARE_PROVIDER_SITE_OTHER): Payer: BLUE CROSS/BLUE SHIELD | Admitting: Obstetrics and Gynecology

## 2016-09-09 VITALS — BP 100/56 | Wt 136.0 lb

## 2016-09-09 DIAGNOSIS — Z3A30 30 weeks gestation of pregnancy: Secondary | ICD-10-CM

## 2016-09-09 DIAGNOSIS — Z3483 Encounter for supervision of other normal pregnancy, third trimester: Secondary | ICD-10-CM

## 2016-09-09 DIAGNOSIS — O2243 Hemorrhoids in pregnancy, third trimester: Secondary | ICD-10-CM

## 2016-09-09 DIAGNOSIS — Z23 Encounter for immunization: Secondary | ICD-10-CM | POA: Diagnosis not present

## 2016-09-09 DIAGNOSIS — Z3480 Encounter for supervision of other normal pregnancy, unspecified trimester: Secondary | ICD-10-CM

## 2016-09-09 MED ORDER — HYDROCORTISONE 2.5 % RE CREA: 1.0000 "application " | TOPICAL_CREAM | Freq: Two times a day (BID) | RECTAL | 0 refills | Status: DC

## 2016-09-09 NOTE — Progress Notes (Signed)
Rash on breast/Hemorrhoids/TDAP/blood transfusion form signed

## 2016-09-09 NOTE — Progress Notes (Signed)
    Routine Prenatal Care Visit  Subjective  Fetal Movement? yes Contractions? no Leaking Fluid? no Vaginal Bleeding? no  Objective   Vitals:   09/09/16 1146  BP: (!) 100/56    @WEIGHTCHANGE @ Urine dipstick shows negative for all components.  General: NAD Pumonary: no increased work of breathing Abdomen: gravid, non-tender, fundal height 28, fetal heart tones 135BPM Extremities: no edema Psychiatric: mood appropriate, affect full   Clinic [facility] Prenatal Labs  Dating LMP=25 week [place] Blood type: --/--/O POS (01/25 1450)   Genetic Screen Late entry Antibody: neg  Anatomic [place]  Rubella:  Immunbe  GTT Early:    N/A           Third trimester: 99 RPR: Non Reactive (03/16 1553)   Flu vaccine  HBsAg:   negative  TDaP vaccine        09/09/16                                      Rhogam: HIV: Non Reactive (03/16 1553)   Baby Food                                               GBS: (For PCN allergy, check sensitivities)  Contraception  Pap:04/07/15 NIL HPV neg  Circumcision  UDS THC positive  Pediatrician    Support Person        Assessment   31 y.o. [id number] at [redacted]w[redacted]d by  11/15/2016, by LMP=25 weej [place] ROB  Pregnancy#6 Problems (from 02/03/16 to present)    No problems associated with this episode.       Plan   Problem List Items Addressed This Visit      Other   Supervision of other normal pregnancy, antepartum   Relevant Orders   Tdap vaccine greater than or equal to 7yo IM    Other Visit Diagnoses    [redacted] weeks gestation of pregnancy    -  Primary   Relevant Orders   Tdap vaccine greater than or equal to 7yo IM   Encounter for supervision of other normal pregnancy in third trimester       Need for diphtheria-tetanus-pertussis (Tdap) vaccine       Relevant Orders   Tdap vaccine greater than or equal to 7yo IM   Hemorrhoids during pregnancy, third trimester         Given rx anusol

## 2016-09-18 LAB — URINE DRUG PANEL 7
AMPHETAMINE: NEGATIVE
BARBITURATE: NEGATIVE
Benzodiazepines: NEGATIVE
CARBOXY THC GC/MS CONF: NEGATIVE
COCAINE: NEGATIVE
Cannabinoid: POSITIVE
OPIATES: NEGATIVE
PHENCYCLIDINE: NEGATIVE

## 2016-09-24 ENCOUNTER — Ambulatory Visit (INDEPENDENT_AMBULATORY_CARE_PROVIDER_SITE_OTHER): Payer: BLUE CROSS/BLUE SHIELD | Admitting: Obstetrics and Gynecology

## 2016-09-24 VITALS — BP 102/60 | Wt 141.0 lb

## 2016-09-24 DIAGNOSIS — Z3A33 33 weeks gestation of pregnancy: Secondary | ICD-10-CM

## 2016-09-24 DIAGNOSIS — Z3480 Encounter for supervision of other normal pregnancy, unspecified trimester: Secondary | ICD-10-CM

## 2016-09-24 NOTE — Progress Notes (Signed)
No vb. No lof.  

## 2016-10-11 ENCOUNTER — Encounter: Payer: BLUE CROSS/BLUE SHIELD | Admitting: Certified Nurse Midwife

## 2016-10-14 ENCOUNTER — Ambulatory Visit (INDEPENDENT_AMBULATORY_CARE_PROVIDER_SITE_OTHER): Payer: BLUE CROSS/BLUE SHIELD | Admitting: Obstetrics & Gynecology

## 2016-10-14 VITALS — BP 100/60 | Wt 141.0 lb

## 2016-10-14 DIAGNOSIS — Z3A36 36 weeks gestation of pregnancy: Secondary | ICD-10-CM | POA: Diagnosis not present

## 2016-10-14 DIAGNOSIS — F1911 Other psychoactive substance abuse, in remission: Secondary | ICD-10-CM

## 2016-10-14 DIAGNOSIS — Z87898 Personal history of other specified conditions: Secondary | ICD-10-CM

## 2016-10-14 DIAGNOSIS — Z3480 Encounter for supervision of other normal pregnancy, unspecified trimester: Secondary | ICD-10-CM

## 2016-10-14 NOTE — Progress Notes (Signed)
PNV, [place], Breast, Labor precautions, GBS

## 2016-10-14 NOTE — Patient Instructions (Signed)

## 2016-10-18 LAB — CULTURE, BETA STREP (GROUP B ONLY): STREP GP B CULTURE: NEGATIVE

## 2016-10-19 LAB — URINE DRUG PANEL 7
AMPHETAMINES, URINE: NEGATIVE ng/mL
BARBITURATE QUANT UR: NEGATIVE ng/mL
BENZODIAZEPINE QUANT UR: NEGATIVE ng/mL
Cannabinoid Quant, Ur: POSITIVE — AB
Cocaine (Metab.): NEGATIVE ng/mL
Opiate Quant, Ur: NEGATIVE ng/mL
PCP Quant, Ur: NEGATIVE ng/mL

## 2016-10-23 ENCOUNTER — Ambulatory Visit (INDEPENDENT_AMBULATORY_CARE_PROVIDER_SITE_OTHER): Payer: BLUE CROSS/BLUE SHIELD | Admitting: Obstetrics & Gynecology

## 2016-10-23 VITALS — BP 100/60 | Wt 142.0 lb

## 2016-10-23 DIAGNOSIS — Z3A37 37 weeks gestation of pregnancy: Secondary | ICD-10-CM

## 2016-10-23 DIAGNOSIS — Z3480 Encounter for supervision of other normal pregnancy, unspecified trimester: Secondary | ICD-10-CM

## 2016-10-23 NOTE — Patient Instructions (Signed)

## 2016-10-23 NOTE — Progress Notes (Signed)
PNV, [place], Breast, Labor precautions GBS NEG Desires BTL, and wants salpingectomy due to [place] [facility]

## 2016-10-30 ENCOUNTER — Ambulatory Visit (INDEPENDENT_AMBULATORY_CARE_PROVIDER_SITE_OTHER): Payer: BLUE CROSS/BLUE SHIELD | Admitting: Certified Nurse Midwife

## 2016-10-30 VITALS — BP 90/58 | Wt 139.0 lb

## 2016-10-30 DIAGNOSIS — Z3493 Encounter for supervision of normal pregnancy, unspecified, third trimester: Secondary | ICD-10-CM | POA: Diagnosis not present

## 2016-10-30 DIAGNOSIS — O2613 Low weight gain in pregnancy, third trimester: Secondary | ICD-10-CM

## 2016-10-30 DIAGNOSIS — O26843 Uterine size-date discrepancy, third trimester: Secondary | ICD-10-CM

## 2016-10-30 DIAGNOSIS — Z3A38 38 weeks gestation of pregnancy: Secondary | ICD-10-CM

## 2016-10-30 DIAGNOSIS — O368120 Decreased fetal movements, second trimester, not applicable or unspecified: Secondary | ICD-10-CM

## 2016-10-30 NOTE — Progress Notes (Signed)
Having irregular cxt since last night. Two days ago arms broke out in a very itchy rash.

## 2016-10-31 ENCOUNTER — Inpatient Hospital Stay
Admission: EM | Admit: 2016-10-31 | Discharge: 2016-11-01 | DRG: 775 | Disposition: A | Discharge status: Home / Self Care | Payer: BLUE CROSS/BLUE SHIELD | Attending: Obstetrics and Gynecology | Admitting: Obstetrics and Gynecology

## 2016-10-31 DIAGNOSIS — O99334 Smoking (tobacco) complicating childbirth: Secondary | ICD-10-CM | POA: Diagnosis not present

## 2016-10-31 DIAGNOSIS — Z3A38 38 weeks gestation of pregnancy: Secondary | ICD-10-CM | POA: Diagnosis not present

## 2016-10-31 DIAGNOSIS — F17210 Nicotine dependence, cigarettes, uncomplicated: Secondary | ICD-10-CM | POA: Diagnosis not present

## 2016-10-31 DIAGNOSIS — Z3493 Encounter for supervision of normal pregnancy, unspecified, third trimester: Secondary | ICD-10-CM | POA: Diagnosis present

## 2016-10-31 LAB — CBC
HEMATOCRIT: 37.2 % (ref 35.0–47.0)
Hemoglobin: 12.6 g/dL (ref 12.0–16.0)
MCH: 30.8 pg (ref 26.0–34.0)
MCHC: 33.8 g/dL (ref 32.0–36.0)
MCV: 91 fL (ref 80.0–100.0)
Platelets: 343 10*3/uL (ref 150–440)
RBC: 4.08 MIL/uL (ref 3.80–5.20)
RDW: 14.1 % (ref 11.5–14.5)
WBC: 16.2 10*3/uL — ABNORMAL HIGH (ref 3.6–11.0)

## 2016-10-31 LAB — URINE DRUG SCREEN, QUALITATIVE (ARMC ONLY)
AMPHETAMINES, UR SCREEN: NOT DETECTED
BENZODIAZEPINE, UR SCRN: NOT DETECTED
Barbiturates, Ur Screen: NOT DETECTED
COCAINE METABOLITE, UR ~~LOC~~: NOT DETECTED
Cannabinoid 50 Ng, Ur ~~LOC~~: POSITIVE — AB
MDMA (Ecstasy)Ur Screen: NOT DETECTED
Methadone Scn, Ur: NOT DETECTED
Opiate, Ur Screen: NOT DETECTED
PHENCYCLIDINE (PCP) UR S: NOT DETECTED
Tricyclic, Ur Screen: NOT DETECTED

## 2016-10-31 LAB — TYPE AND SCREEN
ABO/RH(D): O POS
ANTIBODY SCREEN: NEGATIVE

## 2016-10-31 MED ORDER — TETANUS-DIPHTH-ACELL PERTUSSIS 5-2.5-18.5 LF-MCG/0.5 IM SUSP: 0.5000 mL | Freq: Once | INTRAMUSCULAR | Status: DC

## 2016-10-31 MED ORDER — ONDANSETRON HCL 4 MG PO TABS: 4.0000 mg | ORAL_TABLET | ORAL | Status: DC | PRN

## 2016-10-31 MED ORDER — COCONUT OIL OIL: 1.0000 "application " | TOPICAL_OIL | Status: DC | PRN

## 2016-10-31 MED ORDER — IBUPROFEN 600 MG PO TABS
600.0000 mg | ORAL_TABLET | Freq: Four times a day (QID) | ORAL | Status: DC
  Administered 2016-10-31 – 2016-11-01 (×3): 600 mg via ORAL
  Filled 2016-10-31 (×4): qty 1

## 2016-10-31 MED ORDER — DIPHENHYDRAMINE HCL 25 MG PO CAPS: 25.0000 mg | ORAL_CAPSULE | Freq: Four times a day (QID) | ORAL | Status: DC | PRN

## 2016-10-31 MED ORDER — SIMETHICONE 80 MG PO CHEW: 80.0000 mg | CHEWABLE_TABLET | ORAL | Status: DC | PRN

## 2016-10-31 MED ORDER — WITCH HAZEL-GLYCERIN EX PADS: 1.0000 "application " | MEDICATED_PAD | CUTANEOUS | Status: DC | PRN

## 2016-10-31 MED ORDER — BENZOCAINE-MENTHOL 20-0.5 % EX AERO: 1.0000 "application " | INHALATION_SPRAY | CUTANEOUS | Status: DC | PRN

## 2016-10-31 MED ORDER — DIBUCAINE 1 % RE OINT: 1.0000 "application " | TOPICAL_OINTMENT | RECTAL | Status: DC | PRN

## 2016-10-31 MED ORDER — SENNOSIDES-DOCUSATE SODIUM 8.6-50 MG PO TABS
2.0000 | ORAL_TABLET | ORAL | Status: DC
  Administered 2016-11-01: 2 via ORAL
  Filled 2016-10-31: qty 2

## 2016-10-31 MED ORDER — OXYCODONE-ACETAMINOPHEN 5-325 MG PO TABS
1.0000 | ORAL_TABLET | ORAL | Status: DC | PRN
  Administered 2016-11-01 (×2): 1 via ORAL
  Filled 2016-10-31 (×2): qty 1

## 2016-10-31 MED ORDER — PRENATAL MULTIVITAMIN CH
1.0000 | ORAL_TABLET | Freq: Every day | ORAL | Status: DC
  Administered 2016-11-01: 1 via ORAL

## 2016-10-31 MED ORDER — OXYCODONE-ACETAMINOPHEN 5-325 MG PO TABS
2.0000 | ORAL_TABLET | ORAL | Status: DC | PRN
  Administered 2016-10-31: 2 via ORAL
  Filled 2016-10-31: qty 2

## 2016-10-31 MED ORDER — ACETAMINOPHEN 325 MG PO TABS: 650.0000 mg | ORAL_TABLET | ORAL | Status: DC | PRN

## 2016-10-31 MED ORDER — OXYTOCIN 10 UNIT/ML IJ SOLN
10.0000 [IU] | Freq: Once | INTRAMUSCULAR | Status: AC
  Administered 2016-10-31: 10 [IU] via INTRAMUSCULAR

## 2016-10-31 MED ORDER — ONDANSETRON HCL 4 MG/2ML IJ SOLN: 4.0000 mg | INTRAMUSCULAR | Status: DC | PRN

## 2016-10-31 NOTE — ED Notes (Signed)
98.8 axillary baby temp

## 2016-10-31 NOTE — ED Notes (Signed)
Baby pink in color.

## 2016-10-31 NOTE — ED Notes (Addendum)
Placenta out, no tears.

## 2016-10-31 NOTE — ED Notes (Signed)
Nursery team and Dr. Georgianne Fick at bedside. Dr. Karma Greaser and Sharee Pimple RN and Linus Orn EDT at bedside.

## 2016-10-31 NOTE — ED Notes (Signed)
Dr. Georgianne Fick working on delivering placenta.

## 2016-10-31 NOTE — H&P (Signed)
Obstetric H&P   Chief Complaint: Contractions  Prenatal Care Provider: [facility]  History of Present Illness: 31 y.o. [id number] [redacted]w[redacted]d by 11/09/2016, presenting to emergency department with contractions starting this afternoon, and crowning.  At the time of my arrival patient was pushing to +3, patient delivered with the next contractions.    ABO, Rh: O/Positive/-- (02/13 0000)  Antibody: Negative (02/13 0000)  Rubella:Immune Varicella: Immune RPR: Non Reactive (03/16 1553)  HBsAg: Negative (02/13 0000)  HIV: Non Reactive (03/16 1553)  RPR: Non Reactive (03/16 1553) 1-hr: 99 GBS:   negative  Review of Systems: 10 point review of systems negative unless otherwise noted in HPI  Past Medical History: Past Medical History:  Diagnosis Date  . Cervical dysplasia     Past Surgical History: Past Surgical History:  Procedure Laterality Date  . BREAST ENHANCEMENT SURGERY    . DILATION AND CURETTAGE OF UTERUS    . LEEP      Past Obstetric History:  Past Gynecologic History:  Family History: Family History  Problem Relation Age of Onset  . Cancer Paternal Grandmother        ovarian    Social History: Social History   Social History  . Marital status: Married    Spouse name: N/A  . Number of children: N/A  . Years of education: N/A   Occupational History  . Not on file.   Social History Main Topics  . Smoking status: Current Every Day Smoker    Packs/day: 1.00    Years: 13.00  . Smokeless tobacco: Never Used  . Alcohol use No  . Drug use: No  . Sexual activity: Yes   Other Topics Concern  . Not on file   Social History Narrative  . No narrative on file    Medications: Prior to Admission medications   Medication Sig Start Date End Date Taking? Authorizing Provider  Prenatal Vit-Fe Fumarate-FA (PRENATAL MULTIVITAMIN) TABS tablet Take 1 tablet by mouth daily at 12 noon.   Yes [provider]  PROCTO-MED HC 2.5 % rectal cream Place 1 application rectally as  needed.  09/09/16  Yes [provider]    Allergies: No Known Allergies  Physical Exam: Vitals: Blood pressure (!) 158/99, pulse 84, last menstrual period 02/03/2016, SpO2 96 %, unknown if currently breastfeeding.  Urine Dip Protein: N/A  FHT: N/A Toco: N/A  General: NAD HEENT: normocephalic, anicteric Pulmonary: No increased work of breathing Cardiovascular: RRR, distal pulses 2+ Abdomen: Gravid, non-tender Leopolds: vtx Extremities: no edema, erythema, or tenderness Neurologic: Grossly intact Psychiatric: mood appropriate, affect full  Labs: No results found for this or any previous visit (from the past 24 hour(s)).  Assessment: 31 y.o. [id number] [redacted]w[redacted]d by 11/09/2016, precipitous vaginal delivery  Plan: 1) Precipitous delivery IM pitocin  2) Fetus - viable infant deliveryed  3) BP - monitor if continues to be elevated will obtain PIH labs, UDS pending.  Suspect secondary to patient pushing  4) TDAP - UTD 09/09/16  5) Disposition - anticipated discharge PPD2

## 2016-10-31 NOTE — Discharge Summary (Signed)
OB Discharge Summary     Patient Name: Shannon Little DOB: Jul 22, 1985 MRN: [id number]  Date of admission: 10/31/2016 Delivering MD: Malachy Mood , MD  Date of discharge: 11/01/2016  Admitting diagnosis: FTND (full term normal delivery) [O80] Intrauterine pregnancy: [redacted]w[redacted]d     Secondary diagnosis:  Active Problems:   Precipitous delivery  Additional problems: none     Discharge diagnosis: Term Pregnancy Delivered                                                                                                Post partum procedures:none  Augmentation: none  Complications: precipitous delivery  Hospital course:  Onset of Labor With Vaginal Delivery     31 y.o. yo [id number] at [redacted]w[redacted]d was admitted in Active Labor on 10/31/2016. Patient had an uncomplicated labor course as follows:  Membrane Rupture Time/Date:   ,    Intrapartum Procedures: Episiotomy: None [1]                                         Lacerations:     Patient had a delivery of a Viable infant. This patient has no babies on file. Information for the patient's newborn:  Shannon, Little [[id number]]  Delivery Method: Vag-Spont    [name] had an uncomplicated postpartum course.  She is ambulating, tolerating a regular diet, passing flatus, and urinating well. Patient is discharged home in stable condition on 11/01/16.    Physical exam  Vitals:   11/01/16 0004 11/01/16 0234 11/01/16 0502 11/01/16 0742  BP: (!) 103/53  101/69 110/68  Pulse: (!) 43 92 (!) 52 60  Resp: 14  16 18   Temp: 98.2 F (36.8 C)  97.8 F (36.6 C) 98 F (36.7 C)  TempSrc: Oral  Oral Oral  SpO2:  99% 100% 99%  Weight:      Height:       General: alert, cooperative and no distress  CV: RRR Pulm: CTAB Lochia: appropriate Uterine Fundus: firm Incision: N/A DVT Evaluation: No evidence of DVT seen on physical exam. No cords or calf tenderness. No significant calf/ankle edema. Labs: Lab Results  Component Value Date   WBC 13.0  (H) 11/01/2016   HGB 11.8 (L) 11/01/2016   HCT 35.4 11/01/2016   MCV 91.5 11/01/2016   PLT 314 11/01/2016   CMP Latest Ref Rng & Units 07/04/2016  Glucose 65 - 99 mg/dL 89  BUN 6 - 20 mg/dL <5(L)  Creatinine 0.44 - 1.00 mg/dL 0.51  Sodium 135 - 145 mmol/L 136  Potassium 3.5 - 5.1 mmol/L 3.6  Chloride 101 - 111 mmol/L 107  CO2 22 - 32 mmol/L 24  Calcium 8.9 - 10.3 mg/dL 8.2(L)    Discharge instruction: per After Visit Summary.  After visit meds:  Allergies as of 11/01/2016   No Known Allergies     Medication List    TAKE these medications   ibuprofen 600 MG tablet Commonly known as:  ADVIL,MOTRIN Take 1 tablet (600 mg total) by  mouth every 6 (six) hours.   norethindrone 0.35 MG tablet Commonly known as:  MICRONOR,CAMILA,ERRIN Take 1 tablet (0.35 mg total) by mouth daily.   prenatal multivitamin Tabs tablet Take 1 tablet by mouth daily at 12 noon.   PROCTO-MED HC 2.5 % rectal cream Generic drug:  hydrocortisone Place 1 application rectally as needed.       Diet: routine diet  Activity: Advance as tolerated. Pelvic rest for 6 weeks.   Outpatient follow up:6 weeks Follow up Appt: Follow-up Information    Malachy Mood, MD Follow up in 6 week(s).   Specialty:  Obstetrics and Gynecology Why:  postpartum visit, need to schedule appt in 2-3 weeks with Dr. Georgianne Fick to schedule surgery.  Contact information: [place] [address] [phone]          Postpartum contraception: plans on interval BTL  Newborn Data: This patient has no babies on file. Baby Feeding: formula Disposition:home with mother  Prentice Docker, MD 11/01/2016 9:22 AM

## 2016-10-31 NOTE — ED Notes (Signed)
Contractions present. Pt pushing.   Contractions started at 4am,

## 2016-10-31 NOTE — ED Triage Notes (Signed)
Pt arrived POV. Pt is [redacted] weeks pregnant. Pt reports this is third child. Pt reports urge to push. Pt holding abdomen and saying the baby is coming.

## 2016-10-31 NOTE — ED Notes (Signed)
Baby out, crying present.

## 2016-10-31 NOTE — ED Notes (Signed)
Skin to skin occurring between mom and baby.   Baby came out purple in color, cried instantly. Was warmed and dried off. Hat placed on pt head.

## 2016-10-31 NOTE — Progress Notes (Signed)
Poor weight gain this pregnancy. Decreased FM today. Irregular contractions and occasional sharp shooting pains in vaginal area. Noticed a little dried blood when wiping NST reactive with baseline 135-140 with accelerations to 150s to 155, moderate variability Cervix 3/75%/-1. FH 30cm Labor precautions Growth scan and AFI in 2 days with followup.

## 2016-10-31 NOTE — ED Provider Notes (Signed)
[facility] Emergency Department Provider Note  ____________________________________________   First MD Initiated Contact with Patient 10/31/16 1824     (approximate)  I have reviewed the triage vital signs and the nursing notes.   HISTORY  Chief Complaint Code Stork  Level 5 caveat:  history/ROS limited by acute/critical illness  HPI Shannon Little is a 31 y.o. female G3P2 at 46 weeks who presents by private vehicle in labor.  She reports that her contractions started approximately 12-1/2 hours ago and are now 2 frequent for her to identify.  She states that her water broke more than an hour ago.  She feels the need to push and states that her other 2 pregnancies were delivered within 30 minutes of the onset.  She denies any recent fever or chills, chest pain, or shortness of breath.  Her contractions are severe and constant.  Nothing makes them better or worse.   Past Medical History:  Diagnosis Date  . Cervical dysplasia     Patient Active Problem List   Diagnosis Date Noted  . Supervision of other normal pregnancy, antepartum 01/24/2015    Past Surgical History:  Procedure Laterality Date  . BREAST ENHANCEMENT SURGERY    . DILATION AND CURETTAGE OF UTERUS    . LEEP      Prior to Admission medications   Medication Sig Start Date End Date Taking? Authorizing Provider  Prenatal Vit-Fe Fumarate-FA (PRENATAL MULTIVITAMIN) TABS tablet Take 1 tablet by mouth daily at 12 noon.   Yes [provider]  PROCTO-MED HC 2.5 % rectal cream Place 1 application rectally as needed.  09/09/16  Yes [provider]    Allergies Patient has no known allergies.  Family History  Problem Relation Age of Onset  . Cancer Paternal Grandmother        ovarian    Social History Social History  Substance Use Topics  . Smoking status: Current Every Day Smoker    Packs/day: 1.00    Years: 13.00  . Smokeless tobacco: Never Used  . Alcohol use  No    Review of Systems Level 5 caveat:  history/ROS limited by acute/critical illness ____________________________________________   PHYSICAL EXAM:  VITAL SIGNS: ED Triage Vitals  Enc Vitals Group     BP 10/31/16 1823 (!) 158/99     Pulse Rate 10/31/16 1820 (!) 120     Resp --      Temp --      Temp src --      SpO2 10/31/16 1820 100 %     Weight --      Height --      Head Circumference --      Peak Flow --      Pain Score --      Pain Loc --      Pain Edu? --      Excl. in Magazine? --     Constitutional: Alert and oriented. Appears to be in active labor Cardiovascular: Mild tachycardia, regular rhythm. Good peripheral circulation. Grossly normal heart sounds. Respiratory: Normal respiratory effort but rapid rate.  No retractions. Lungs CTAB. Gastrointestinal: Gravid uterus consistent with full-term Genitourinary: Sterile glove examination reveals crowning, no evidence of cord is appreciated Musculoskeletal: No lower extremity tenderness nor edema. No gross deformities of extremities. Neurologic:  Normal speech and language. No gross focal neurologic deficits are appreciated.  Skin:  Skin is warm, dry and intact.  Psychiatric: Mood and affect are normal. Speech and behavior are normal.  ____________________________________________   LABS (all labs ordered are listed, but only abnormal results are displayed)  Labs Reviewed  CBC  RPR  TYPE AND SCREEN   ____________________________________________  EKG  None - EKG not ordered by ED physician ____________________________________________  RADIOLOGY   No results found.  ____________________________________________   PROCEDURES  Critical Care performed: Yes, see critical care procedure note(s)   Procedure(s) performed:   .Critical Care Performed by: Hinda Kehr Authorized by: Hinda Kehr   Critical care provider statement:    Critical care time (minutes):  30   Critical care time was exclusive  of:  Separately billable procedures and treating other patients   Critical care was time spent personally by me on the following activities:  Development of treatment plan with patient or surrogate, discussions with consultants, evaluation of patient's response to treatment, examination of patient, obtaining history from patient or surrogate, ordering and performing treatments and interventions, ordering and review of laboratory studies, ordering and review of radiographic studies, pulse oximetry, re-evaluation of patient's condition and review of old charts     ____________________________________________   INITIAL IMPRESSION / [place] / ED COURSE  Pertinent labs & imaging results that were available during my care of the patient were reviewed by me and considered in my medical decision making (see chart for details).  We called a code stork immediately upon my evaluation which determined that the baby was crowning.  The emergency delivery kit was opened and prepared.  IV access was established and blood was drawn.  We were prepared for delivery when the OB/GYN, Dr. Star Age, arrived in the room.  Within only a few minutes the patient delivered with the assistance of Dr. Star Age.  I did not have direct contact with the newborn and the delivery was managed by Dr. Star Age after the initial assessment.      ____________________________________________  FINAL CLINICAL IMPRESSION(S) / ED DIAGNOSES  Final diagnoses:  FTND (full term normal delivery)     MEDICATIONS GIVEN DURING THIS VISIT:  Medications  oxytocin (PITOCIN) injection 10 Units (not administered)     NEW OUTPATIENT MEDICATIONS STARTED DURING THIS VISIT:  New Prescriptions   No medications on file    Modified Medications   No medications on file    Discontinued Medications   AMOXICILLIN (AMOXIL) 500 MG CAPSULE       BUPROPION (ZYBAN) 150 MG 12 HR TABLET    Take 150 mg by mouth.      Note:  This  document was prepared using Dragon voice recognition software and may include unintentional dictation errors.    Hinda Kehr, MD 10/31/16 [phone]

## 2016-11-01 ENCOUNTER — Other Ambulatory Visit: Payer: BLUE CROSS/BLUE SHIELD

## 2016-11-01 ENCOUNTER — Encounter: Payer: BLUE CROSS/BLUE SHIELD | Admitting: Certified Nurse Midwife

## 2016-11-01 LAB — RPR: RPR: NONREACTIVE

## 2016-11-01 LAB — CBC
HCT: 35.4 % (ref 35.0–47.0)
Hemoglobin: 11.8 g/dL — ABNORMAL LOW (ref 12.0–16.0)
MCH: 30.6 pg (ref 26.0–34.0)
MCHC: 33.4 g/dL (ref 32.0–36.0)
MCV: 91.5 fL (ref 80.0–100.0)
PLATELETS: 314 10*3/uL (ref 150–440)
RBC: 3.86 MIL/uL (ref 3.80–5.20)
RDW: 13.8 % (ref 11.5–14.5)
WBC: 13 10*3/uL — AB (ref 3.6–11.0)

## 2016-11-01 MED ORDER — NORETHINDRONE 0.35 MG PO TABS: 1.0000 | ORAL_TABLET | Freq: Every day | ORAL | 3 refills | Status: DC

## 2016-11-01 MED ORDER — IBUPROFEN 600 MG PO TABS: 600.0000 mg | ORAL_TABLET | Freq: Four times a day (QID) | ORAL | 0 refills | Status: DC

## 2016-11-01 NOTE — Progress Notes (Signed)
Dc instructions given. rx given. Follow up given. Verbalizes understanding dc home with spouse and baby

## 2016-11-14 ENCOUNTER — Encounter: Payer: BLUE CROSS/BLUE SHIELD | Admitting: Certified Nurse Midwife

## 2016-12-10 ENCOUNTER — Encounter: Payer: Self-pay | Admitting: Obstetrics and Gynecology

## 2016-12-10 ENCOUNTER — Telehealth: Payer: Self-pay

## 2016-12-10 NOTE — Telephone Encounter (Signed)
Pt needs letter faxed to unemployment that she can return to work from maternity leave.  [phone]

## 2016-12-10 NOTE — Telephone Encounter (Signed)
Pt aware note is ready for pick up

## 2016-12-10 NOTE — Telephone Encounter (Signed)
Its in her chart under letters

## 2016-12-10 NOTE — Telephone Encounter (Signed)
AMS, pt has her 6 week pp visit with you on 7/24. Please advise on a note being written for her to go to work sooner.

## 2016-12-30 ENCOUNTER — Ambulatory Visit (INDEPENDENT_AMBULATORY_CARE_PROVIDER_SITE_OTHER): Payer: BLUE CROSS/BLUE SHIELD | Admitting: Obstetrics and Gynecology

## 2016-12-30 ENCOUNTER — Encounter: Payer: Self-pay | Admitting: Obstetrics and Gynecology

## 2016-12-30 DIAGNOSIS — Z308 Encounter for other contraceptive management: Secondary | ICD-10-CM

## 2016-12-30 DIAGNOSIS — F53 Postpartum depression: Secondary | ICD-10-CM

## 2016-12-30 DIAGNOSIS — O99345 Other mental disorders complicating the puerperium: Secondary | ICD-10-CM

## 2016-12-30 MED ORDER — SERTRALINE HCL 50 MG PO TABS: 50.0000 mg | ORAL_TABLET | Freq: Every day | ORAL | 2 refills | Status: DC

## 2016-12-30 NOTE — Progress Notes (Signed)
Postpartum Visit  Chief Complaint:  Chief Complaint  Patient presents with  . Postpartum Care    Delivered 5/24    History of Present Illness: Patient is a 31 y.o. [id number] presents for postpartum visit.  Date of delivery: 10/31/16 Type of delivery: Vaginal delivery - Vacuum or forceps assisted  no Episiotomy No.  Laceration: no  Pregnancy or labor problems:  no Any problems since the delivery:  no  Newborn Details:  SINGLETON :  1. Birth weight: 5lbs 7oz Maternal Details:  Breast Feeding:  no Post partum depression/anxiety noted:  yes Edinburgh Post-Partum Depression Score:  16 Date of last PAP: 04/07/15  NIL HPV negative  Review of Systems: Review of Systems  Constitutional: Negative for malaise/fatigue.  Gastrointestinal: Negative for abdominal pain.  Psychiatric/Behavioral: Positive for depression. Negative for hallucinations, memory loss, substance abuse and suicidal ideas. The patient has insomnia. The patient is not nervous/anxious.     Past Medical History:  Past Medical History:  Diagnosis Date  . Cervical dysplasia     Past Surgical History:  Past Surgical History:  Procedure Laterality Date  . BREAST ENHANCEMENT SURGERY    . DILATION AND CURETTAGE OF UTERUS    . LEEP      Family History:  Family History  Problem Relation Age of Onset  . Cancer Paternal Grandmother        ovarian    Social History:  Social History   Social History  . Marital status: Married    Spouse name: N/A  . Number of children: N/A  . Years of education: N/A   Occupational History  . Not on file.   Social History Main Topics  . Smoking status: Current Every Day Smoker    Packs/day: 1.00    Years: 13.00  . Smokeless tobacco: Never Used  . Alcohol use No  . Drug use: No  . Sexual activity: Yes   Other Topics Concern  . Not on file   Social History Narrative  . No narrative on file    Allergies:  No Known Allergies  Medications: Prior to Admission  medications   Medication Sig Start Date End Date Taking? Authorizing Provider  ibuprofen (ADVIL,MOTRIN) 600 MG tablet Take 1 tablet (600 mg total) by mouth every 6 (six) hours. 11/01/16   Will Bonnet, MD  norethindrone (MICRONOR,CAMILA,ERRIN) 0.35 MG tablet Take 1 tablet (0.35 mg total) by mouth daily. 11/01/16   Will Bonnet, MD  Prenatal Vit-Fe Fumarate-FA (PRENATAL MULTIVITAMIN) TABS tablet Take 1 tablet by mouth daily at 12 noon.    [provider]  PROCTO-MED HC 2.5 % rectal cream Place 1 application rectally as needed.  09/09/16   [provider]    Physical Exam Vitals:  Vitals:   12/30/16 1604  BP: 112/60  Pulse: (!) 59    General: NAD HEENT: normocephalic, anicteric Pulmonary: No increased work of breathing Abdomen: NABS, soft, non-tender, non-distended.  Umbilicus without lesions.  No hepatomegaly, splenomegaly or masses palpable. No evidence of hernia. Genitourinary:  External: Normal external female genitalia.  Normal urethral meatus, normal  Bartholin's and Skene's glands.    Vagina: Normal vaginal mucosa, no evidence of prolapse.    Cervix: Grossly normal in appearance, no bleeding  Uterus: Non-enlarged, mobile, normal contour.  No CMT  Adnexa: ovaries non-enlarged, no adnexal masses  Rectal: deferred Extremities: no edema, erythema, or tenderness Neurologic: Grossly intact Psychiatric: mood appropriate, affect full  Assessment: 31 y.o. [id number] presenting for 6 week postpartum  visit  Plan: Problem List Items Addressed This Visit    None    Visit Diagnoses    Encounter for postpartum visit    -  Primary   Postpartum depression       Relevant Medications   sertraline (ZOLOFT) 50 MG tablet   Encounter for tubal ligation counseling           1) Contraception Education given regarding options for contraception, including BTL.  2)  Pap - ASCCP guidelines and rational discussed.  Patient opts for q3year screening interval  3)  Patient underwent screening for postpartum depression with some concerns noted. - EPDS 16 item 10 is 0 - start zoloft  4) Follow up 1 year for routine annual exam

## 2016-12-31 ENCOUNTER — Telehealth: Payer: Self-pay | Admitting: Obstetrics and Gynecology

## 2016-12-31 NOTE — Telephone Encounter (Signed)
-----   Message from Malachy Mood, MD sent at 12/30/2016 10:24 PM EDT ----- Regarding: surgery Surgery Date: 1-4 weeks  LOS: outpatient  Surgery Booking Request Patient Full Name: Shannon Little MRN: [id number]  DOB: [birth year]  Surgeon: Malachy Mood, MD  Requested Surgery Date and Time: 1-4 weeks Primary Diagnosis and Code: Undesired fertility Secondary Diagnosis and Code:  Surgical Procedure: laparoscopic tubal ligation L&D Notification:N/A Admission Status: same day surgery Length of Surgery: 1h Special Case Needs: none H&P:  (date) Phone Interview or Office Pre-Admit: either Interpreter: none Language: english Medical Clearance: none Special Scheduling Instructions: none

## 2016-12-31 NOTE — Telephone Encounter (Signed)
Lmtrc

## 2017-01-02 NOTE — Telephone Encounter (Signed)
Patient is scheduled for OR on 01/21/17. Patient has my ext.

## 2017-01-02 NOTE — Telephone Encounter (Signed)
Lmtrc

## 2017-01-13 ENCOUNTER — Encounter
Admission: RE | Admit: 2017-01-13 | Discharge: 2017-01-13 | Disposition: A | Discharge status: Home / Self Care | Payer: BLUE CROSS/BLUE SHIELD | Source: Ambulatory Visit | Attending: Obstetrics and Gynecology | Admitting: Obstetrics and Gynecology

## 2017-01-13 HISTORY — DX: Adverse effect of unspecified anesthetic, initial encounter: T41.45XA

## 2017-01-13 HISTORY — DX: Depression, unspecified: F32.A

## 2017-01-13 HISTORY — DX: Major depressive disorder, single episode, unspecified: F32.9

## 2017-01-13 HISTORY — DX: Gastro-esophageal reflux disease without esophagitis: K21.9

## 2017-01-13 HISTORY — DX: Other complications of anesthesia, initial encounter: T88.59XA

## 2017-01-13 NOTE — Patient Instructions (Signed)
  Your procedure is scheduled on: 01-21-17 Report to Same Day Surgery 2nd floor medical mall [facility] Entrance-take elevator on left to 2nd floor.  Check in with surgery information desk.) To find out your arrival time please call [phone] between 1PM - 3PM on 8-`3-`8  Remember: Instructions that are not followed completely may result in serious medical risk, up to and including death, or upon the discretion of your surgeon and anesthesiologist your surgery may need to be rescheduled.    _x___ 1. Do not eat food or drink liquids after midnight. No gum chewing or hard candies.     __x__ 2. No Alcohol for 24 hours before or after surgery.   __x__3. No Smoking for 24 prior to surgery.   ____  4. Bring all medications with you on the day of surgery if instructed.    __x__ 5. Notify your doctor if there is any change in your medical condition     (cold, fever, infections).     Do not wear jewelry, make-up, hairpins, clips or nail polish.  Do not wear lotions, powders, or perfumes. You may wear deodorant.  Do not shave 48 hours prior to surgery. Men may shave face and neck.  Do not bring valuables to the hospital.    [facility] is not responsible for any belongings or valuables.               Contacts, dentures or bridgework may not be worn into surgery.  Leave your suitcase in the car. After surgery it may be brought to your room.  For patients admitted to the hospital, discharge time is determined by your  treatment team.   Patients discharged the day of surgery will not be allowed to drive home.  You will need someone to drive you home and stay with you the night of your procedure.    Please read over the following fact sheets that you were given:     _x___ Take anti-hypertensive (unless it includes a diuretic), cardiac, seizure, asthma,     anti-reflux and psychiatric medicines. These include:  1. ZOLOFT  2.  3.  4.  5.  6.  ____Fleets enema or Magnesium Citrate as  directed.   ____ Use CHG Soap or sage wipes as directed on instruction sheet   ____ Use inhalers on the day of surgery and bring to hospital day of surgery  ____ Stop Metformin and Janumet 2 days prior to surgery.    ____ Take 1/2 of usual insulin dose the night before surgery and none on the morning surgery.   ____ Follow recommendations from Cardiologist, Pulmonologist or PCP regarding  stopping Aspirin, Coumadin, Pllavix ,Eliquis, Effient, or Pradaxa, and Pletal.  X____Stop Anti-inflammatories such as Advil, Aleve, Ibuprofen, Motrin, Naproxen, Naprosyn, Goodies powders or aspirin products  NOW-OK to take Tylenol    ____ Stop supplements until after surgery.    ____ Bring C-Pap to the hospital.

## 2017-01-21 ENCOUNTER — Ambulatory Visit
Admission: RE | Admit: 2017-01-21 | Discharge: 2017-01-21 | Disposition: A | Discharge status: Home / Self Care | Payer: BLUE CROSS/BLUE SHIELD | Source: Ambulatory Visit | Attending: Obstetrics and Gynecology | Admitting: Obstetrics and Gynecology

## 2017-01-21 ENCOUNTER — Ambulatory Visit: Payer: BLUE CROSS/BLUE SHIELD | Admitting: Anesthesiology

## 2017-01-21 ENCOUNTER — Encounter
Admission: RE | Disposition: A | Discharge status: Home / Self Care | Payer: Self-pay | Source: Ambulatory Visit | Attending: Obstetrics and Gynecology

## 2017-01-21 DIAGNOSIS — F17200 Nicotine dependence, unspecified, uncomplicated: Secondary | ICD-10-CM | POA: Insufficient documentation

## 2017-01-21 DIAGNOSIS — Z8041 Family history of malignant neoplasm of ovary: Secondary | ICD-10-CM | POA: Diagnosis not present

## 2017-01-21 DIAGNOSIS — D252 Subserosal leiomyoma of uterus: Secondary | ICD-10-CM | POA: Diagnosis not present

## 2017-01-21 DIAGNOSIS — Z9079 Acquired absence of other genital organ(s): Secondary | ICD-10-CM

## 2017-01-21 DIAGNOSIS — Z79899 Other long term (current) drug therapy: Secondary | ICD-10-CM | POA: Diagnosis not present

## 2017-01-21 DIAGNOSIS — D282 Benign neoplasm of uterine tubes and ligaments: Secondary | ICD-10-CM | POA: Insufficient documentation

## 2017-01-21 DIAGNOSIS — F329 Major depressive disorder, single episode, unspecified: Secondary | ICD-10-CM | POA: Diagnosis not present

## 2017-01-21 DIAGNOSIS — Z302 Encounter for sterilization: Secondary | ICD-10-CM | POA: Diagnosis not present

## 2017-01-21 DIAGNOSIS — Z9889 Other specified postprocedural states: Secondary | ICD-10-CM | POA: Insufficient documentation

## 2017-01-21 HISTORY — PX: LAPAROSCOPIC TUBAL LIGATION: SHX1937

## 2017-01-21 LAB — URINE DRUG SCREEN, QUALITATIVE (ARMC ONLY)
AMPHETAMINES, UR SCREEN: NOT DETECTED
BENZODIAZEPINE, UR SCRN: NOT DETECTED
Barbiturates, Ur Screen: NOT DETECTED
COCAINE METABOLITE, UR ~~LOC~~: NOT DETECTED
Cannabinoid 50 Ng, Ur ~~LOC~~: POSITIVE — AB
MDMA (Ecstasy)Ur Screen: NOT DETECTED
METHADONE SCREEN, URINE: NOT DETECTED
OPIATE, UR SCREEN: NOT DETECTED
Phencyclidine (PCP) Ur S: NOT DETECTED
TRICYCLIC, UR SCREEN: NOT DETECTED

## 2017-01-21 LAB — POCT PREGNANCY, URINE: PREG TEST UR: NEGATIVE

## 2017-01-21 SURGERY — LIGATION, FALLOPIAN TUBE, LAPAROSCOPIC
Anesthesia: General | Laterality: Bilateral | Wound class: Clean Contaminated

## 2017-01-21 MED ORDER — DEXAMETHASONE SODIUM PHOSPHATE 10 MG/ML IJ SOLN
INTRAMUSCULAR | Status: DC | PRN
  Administered 2017-01-21: 10 mg via INTRAVENOUS

## 2017-01-21 MED ORDER — FENTANYL CITRATE (PF) 100 MCG/2ML IJ SOLN
INTRAMUSCULAR | Status: AC
  Filled 2017-01-21: qty 2

## 2017-01-21 MED ORDER — IBUPROFEN 600 MG PO TABS: 600.0000 mg | ORAL_TABLET | Freq: Four times a day (QID) | ORAL | 0 refills | Status: DC | PRN

## 2017-01-21 MED ORDER — ONDANSETRON HCL 4 MG/2ML IJ SOLN
INTRAMUSCULAR | Status: DC | PRN
  Administered 2017-01-21: 4 mg via INTRAVENOUS

## 2017-01-21 MED ORDER — FENTANYL CITRATE (PF) 100 MCG/2ML IJ SOLN
INTRAMUSCULAR | Status: DC | PRN
  Administered 2017-01-21 (×3): 50 ug via INTRAVENOUS

## 2017-01-21 MED ORDER — EPHEDRINE SULFATE 50 MG/ML IJ SOLN: INTRAMUSCULAR | Status: DC | PRN

## 2017-01-21 MED ORDER — FAMOTIDINE 20 MG PO TABS
20.0000 mg | ORAL_TABLET | Freq: Once | ORAL | Status: AC
  Administered 2017-01-21: 20 mg via ORAL

## 2017-01-21 MED ORDER — LIDOCAINE HCL (CARDIAC) 20 MG/ML IV SOLN
INTRAVENOUS | Status: DC | PRN
  Administered 2017-01-21: 40 mg via INTRAVENOUS

## 2017-01-21 MED ORDER — FAMOTIDINE 20 MG PO TABS
ORAL_TABLET | ORAL | Status: AC
  Administered 2017-01-21: 20 mg via ORAL
  Filled 2017-01-21: qty 1

## 2017-01-21 MED ORDER — ONDANSETRON HCL 4 MG/2ML IJ SOLN: 4.0000 mg | Freq: Once | INTRAMUSCULAR | Status: DC | PRN

## 2017-01-21 MED ORDER — SUGAMMADEX SODIUM 200 MG/2ML IV SOLN
INTRAVENOUS | Status: AC
  Filled 2017-01-21: qty 2

## 2017-01-21 MED ORDER — FENTANYL CITRATE (PF) 100 MCG/2ML IJ SOLN
INTRAMUSCULAR | Status: AC
  Administered 2017-01-21: 25 ug via INTRAVENOUS
  Filled 2017-01-21: qty 2

## 2017-01-21 MED ORDER — MIDAZOLAM HCL 2 MG/2ML IJ SOLN
INTRAMUSCULAR | Status: AC
  Filled 2017-01-21: qty 2

## 2017-01-21 MED ORDER — SUGAMMADEX SODIUM 200 MG/2ML IV SOLN
INTRAVENOUS | Status: DC | PRN
  Administered 2017-01-21: 200 mg via INTRAVENOUS

## 2017-01-21 MED ORDER — PROPOFOL 10 MG/ML IV BOLUS
INTRAVENOUS | Status: DC | PRN
Start: 2017-01-21 — End: 2017-01-21
  Administered 2017-01-21: 130 mg via INTRAVENOUS

## 2017-01-21 MED ORDER — EPHEDRINE SULFATE 50 MG/ML IJ SOLN
INTRAMUSCULAR | Status: DC | PRN
  Administered 2017-01-21: 5 mg via INTRAVENOUS

## 2017-01-21 MED ORDER — FENTANYL CITRATE (PF) 100 MCG/2ML IJ SOLN
25.0000 ug | INTRAMUSCULAR | Status: DC | PRN
  Administered 2017-01-21 (×3): 25 ug via INTRAVENOUS

## 2017-01-21 MED ORDER — GLYCOPYRROLATE 0.2 MG/ML IJ SOLN
INTRAMUSCULAR | Status: DC | PRN
  Administered 2017-01-21: .2 mg via INTRAVENOUS

## 2017-01-21 MED ORDER — LACTATED RINGERS IV SOLN
INTRAVENOUS | Status: DC
  Administered 2017-01-21 (×3): via INTRAVENOUS

## 2017-01-21 MED ORDER — OXYCODONE-ACETAMINOPHEN 5-325 MG PO TABS
1.0000 | ORAL_TABLET | Freq: Four times a day (QID) | ORAL | Status: DC | PRN
  Administered 2017-01-21: 1 via ORAL

## 2017-01-21 MED ORDER — OXYCODONE-ACETAMINOPHEN 5-325 MG PO TABS
ORAL_TABLET | ORAL | Status: AC
  Filled 2017-01-21: qty 1

## 2017-01-21 MED ORDER — OXYCODONE-ACETAMINOPHEN 5-325 MG PO TABS: 1.0000 | ORAL_TABLET | Freq: Four times a day (QID) | ORAL | 0 refills | Status: DC | PRN

## 2017-01-21 MED ORDER — MIDAZOLAM HCL 2 MG/2ML IJ SOLN
INTRAMUSCULAR | Status: DC | PRN
  Administered 2017-01-21 (×2): 1 mg via INTRAVENOUS

## 2017-01-21 MED ORDER — BUPIVACAINE HCL 0.5 % IJ SOLN
INTRAMUSCULAR | Status: DC | PRN
  Administered 2017-01-21: 11 mL

## 2017-01-21 MED ORDER — ROCURONIUM BROMIDE 100 MG/10ML IV SOLN
INTRAVENOUS | Status: DC | PRN
  Administered 2017-01-21: 30 mg via INTRAVENOUS

## 2017-01-21 MED ORDER — PROPOFOL 10 MG/ML IV BOLUS
INTRAVENOUS | Status: AC
  Filled 2017-01-21: qty 20

## 2017-01-21 SURGICAL SUPPLY — 19 items
BLADE SURG SZ11 CARB STEEL (BLADE) ×2 IMPLANT
CATH ROBINSON RED A/P 16FR (CATHETERS) ×2 IMPLANT
CHLORAPREP W/TINT 26ML (MISCELLANEOUS) ×2 IMPLANT
DERMABOND ADVANCED (GAUZE/BANDAGES/DRESSINGS) ×1
DERMABOND ADVANCED .7 DNX12 (GAUZE/BANDAGES/DRESSINGS) ×1 IMPLANT
DRSG TEGADERM 2-3/8X2-3/4 SM (GAUZE/BANDAGES/DRESSINGS) IMPLANT
GLOVE BIO SURGEON STRL SZ7 (GLOVE) ×6 IMPLANT
GLOVE INDICATOR 7.5 STRL GRN (GLOVE) ×6 IMPLANT
GOWN STRL REUS W/ TWL LRG LVL3 (GOWN DISPOSABLE) ×3 IMPLANT
GOWN STRL REUS W/TWL LRG LVL3 (GOWN DISPOSABLE) ×3
KIT RM TURNOVER CYSTO AR (KITS) ×2 IMPLANT
LABEL OR SOLS (LABEL) IMPLANT
NS IRRIG 500ML POUR BTL (IV SOLUTION) ×2 IMPLANT
PACK GYN LAPAROSCOPIC (MISCELLANEOUS) ×2 IMPLANT
PAD OB MATERNITY 4.3X12.25 (PERSONAL CARE ITEMS) ×2 IMPLANT
PAD PREP 24X41 OB/GYN DISP (PERSONAL CARE ITEMS) ×2 IMPLANT
SUT MNCRL AB 4-0 PS2 18 (SUTURE) IMPLANT
TROCAR XCEL NON-BLD 5MMX100MML (ENDOMECHANICALS) ×2 IMPLANT
TUBING INSUFFLATOR HI FLOW (MISCELLANEOUS) ×2 IMPLANT

## 2017-01-21 NOTE — Anesthesia Postprocedure Evaluation (Signed)
Anesthesia Post Note  Patient: Shannon Little  Procedure(s) Performed: Procedure(s) (LRB): LAPAROSCOPIC TUBAL LIGATION (Bilateral)  Patient location during evaluation: PACU Anesthesia Type: General Level of consciousness: awake and alert and oriented Pain management: pain level controlled Vital Signs Assessment: post-procedure vital signs reviewed and stable Respiratory status: spontaneous breathing Cardiovascular status: blood pressure returned to baseline Anesthetic complications: no     Last Vitals:  Vitals:   01/21/17 1117 01/21/17 1435  BP: 108/64 128/84  Pulse: (!) 58 (!) 50  Resp: 16 12  Temp: (!) 36.3 C (!) 36.3 C  SpO2: 100% 100%    Last Pain:  Vitals:   01/21/17 1117  TempSrc: Tympanic                 [name]

## 2017-01-21 NOTE — Anesthesia Post-op Follow-up Note (Signed)
Anesthesia QCDR form completed.        

## 2017-01-21 NOTE — H&P (Addendum)
Obstetrics & Gynecology Surgery H&P    Chief Complaint: Scheduled Surgery   History of Present Illness: Patient is a 31 y.o. [id number] presenting for scheduled laparoscopic BTL, for the treatment or further evaluation of undesired fertility. She does have a family history of ovarian cancer in her paternal grandmother and opts for salpingectomy as her mode of sterilization for ovarian cancer risk reduction.  Review of Systems:10 point review of systems  Past Medical History:  Past Medical History:  Diagnosis Date  . Cervical dysplasia   . Complication of anesthesia    HARD TO WAKE UP AFTER D & C  . Depression   . GERD (gastroesophageal reflux disease)    H/O WITH PREGNANCY    Past Surgical History:  Past Surgical History:  Procedure Laterality Date  . BREAST ENHANCEMENT SURGERY    . DILATION AND CURETTAGE OF UTERUS    . LEEP      Family History:  Family History  Problem Relation Age of Onset  . Cancer Paternal Grandmother        ovarian    Social History:  Social History   Social History  . Marital status: Married    Spouse name: N/A  . Number of children: N/A  . Years of education: N/A   Occupational History  . Not on file.   Social History Main Topics  . Smoking status: Current Every Day Smoker    Packs/day: 0.50    Years: 13.00  . Smokeless tobacco: Never Used  . Alcohol use No  . Drug use: Yes    Types: Marijuana     Comment: UDS + FOR MARIJUANA ON 10-31-16  . Sexual activity: Yes   Other Topics Concern  . Not on file   Social History Narrative  . No narrative on file    Allergies:  No Known Allergies  Medications: Prior to Admission medications   Medication Sig Start Date End Date Taking? Authorizing Provider  Multiple Vitamin (MULTIVITAMIN WITH MINERALS) TABS tablet Take 1 tablet by mouth daily.   Yes [provider]  PROCTO-MED HC 2.5 % rectal cream Place 1 application rectally as needed for hemorrhoids.  09/09/16  Yes  [provider]  sertraline (ZOLOFT) 50 MG tablet Take 1 tablet (50 mg total) by mouth daily. Patient taking differently: Take 50 mg by mouth every morning.  12/30/16 12/30/17 Yes Malachy Mood, MD  ibuprofen (ADVIL,MOTRIN) 600 MG tablet Take 1 tablet (600 mg total) by mouth every 6 (six) hours. Patient not taking: Reported on 01/10/2017 11/01/16   Will Bonnet, MD  norethindrone (MICRONOR,CAMILA,ERRIN) 0.35 MG tablet Take 1 tablet (0.35 mg total) by mouth daily. Patient not taking: Reported on 01/10/2017 11/01/16   Will Bonnet, MD    Physical Exam Vitals: Blood pressure 108/64, pulse (!) 58, temperature (!) 97.3 F (36.3 C), temperature source Tympanic, resp. rate 16, height 5\' 3"  (1.6 m), weight 120 lb (54.4 kg), last menstrual period 01/07/2017, SpO2 100 %, not currently breastfeeding. General: NAD HEENT: normocephalic, anicteric Pulmonary: CTAB, No increased work of breathing Cardiovascular: RRR, distal pulses 2+ Abdomen: soft, non-tender Genitourinary: deferred Extremities: no edema, erythema, or tenderness Neurologic: Grossly intact Psychiatric: mood appropriate, affect full  Imaging No results found.   Results for orders placed or performed during the hospital encounter of 01/21/17 (from the past 24 hour(s))  Urine Drug Screen, Qualitative ([facility] only)     Status: Abnormal   Collection Time: 01/21/17 11:00 AM  Result Value Ref  Range   Tricyclic, Ur Screen NONE DETECTED NONE DETECTED   Amphetamines, Ur Screen NONE DETECTED NONE DETECTED   MDMA (Ecstasy)Ur Screen NONE DETECTED NONE DETECTED   Cocaine Metabolite,Ur [place] NONE DETECTED NONE DETECTED   Opiate, Ur Screen NONE DETECTED NONE DETECTED   Phencyclidine (PCP) Ur S NONE DETECTED NONE DETECTED   Cannabinoid 50 Ng, Ur [place] POSITIVE (A) NONE DETECTED   Barbiturates, Ur Screen NONE DETECTED NONE DETECTED   Benzodiazepine, Ur Scrn NONE DETECTED NONE DETECTED   Methadone Scn, Ur NONE DETECTED NONE DETECTED    Pregnancy, urine POC     Status: None   Collection Time: 01/21/17 11:07 AM  Result Value Ref Range   Preg Test, Ur NEGATIVE NEGATIVE   Assessment: 32 y.o. [id number] presenting for scheduled laparoscopic BTL  Plan: 52)31 y.o. [id number]  with undesired fertility, desires permanent sterilization.  Other reversible forms of contraception were discussed with patient; she declines all other modalities. Permanent nature of as well as associated risks of the procedure discussed with patient including but not limited to: risk of regret, permanence of method, bleeding, infection, injury to surrounding organs and need for additional procedures.  Failure risk of 0.5-1% with increased risk of ectopic gestation if pregnancy occurs was also discussed with patient.    2) Routine postoperative instructions were reviewed with the patient and her family in detail today including the expected length of recovery and likely postoperative course.  The patient concurred with the proposed plan, giving informed written consent for the surgery today.  Patient instructed on the importance of being NPO after midnight prior to her procedure.  If warranted preoperative prophylactic antibiotics and SCDs ordered on call to the OR to meet SCIP guidelines and adhere to recommendation laid forth in [place] Number 104 May 2009  "Antibiotic Prophylaxis for Gynecologic Procedures".

## 2017-01-21 NOTE — Op Note (Signed)
Preoperative Diagnosis: 1) 31 y.o. undesired fertility Postoperative Diagnosis: 1) 31 y.o. undesired fertility  Operation Performed: Laparoscopic bilateral tubal ligation via bilateral salpingectomy  Indication: 31 y.o. [id number]  with undesired fertility, desires permanent sterilization.  Other reversible forms of contraception were discussed with patient; she declines all other modalities. Permanent nature of as well as associated risks of the procedure discussed with patient including but not limited to: risk of regret, permanence of method, bleeding, infection, injury to surrounding organs and need for additional procedures.  Failure risk of 0.5-1% with increased risk of ectopic gestation if pregnancy occurs was also discussed with patient.    Surgeon: Malachy Mood, MD  Anesthesia: General  Preoperative Antibiotics: none  Estimated Blood Loss: minimal  IV Fluids: 1L  Urine Output:: 181mL  Drains or Tubes: none  Implants: none  Specimens Removed: bilateral portion of fallopian tube  Complications: none  Intraoperative Findings: Normal tubes, ovaries, and uterus.  Good 1cm knuckle of tube applied within each falope ring.  Patient Condition: stable  Procedure in Detail:  Patient was taken to the operating room where she was administered general anesthesia.  She was positioned in the dorsal lithotomy position utilizing Allen stirups, prepped and draped in the usual sterile fashion.  Prior to proceeding with procedure a time out was performed.  Attention was turned to the patient's pelvis.  A red rubber catheter was used to empty the patient's bladder.  An operative speculum was placed to allow visualization of the cervix.  The anterior lip of the cervix was grasped with a single tooth tenaculum, and a Hulka tenaculum was placed to allow manipulation of the uterus.  The operative speculum and single tooth tenaculum were then removed.  Attention was turned to the patient's abdomen.   The umbilicus was infiltrated with 1% Sensorcaine, before making a stab incision using an 11 blade scalpel.  A 88mm Excel trocar was then used to gain direct entry into the peritoneal cavity utilizing the camera to visualize progress of the trocar during placement.  Once peritoneal entry had been achieved, insufflation was started and pneumoperitoneum established at a pressure of 33mmHg.   General inspection of the abdomen revealed the above noted findings.  A left and right lower quadrant site were injected with 1% Sensorcaine and a stab incision was made using an 11 blade scalpel.  Two additinoal 80mm Excel trocarsring were placed through these incision under direct visualization.  The right tube was identified and grasped at its fimbriate end, dissected off the ovary mesosalpinx, and uterus using a 18mm harmonic scalpel. The left tube was identified and a dissected in a similar fashion.  Pneumoperitoneum was evacuated.  The trocars were removed.  All trocar sites were then dressed with surgical skin glue.  The Hulka tenaculum was removed.  Sponge needle and instrument counts were correct time two.  The patient tolerated the procedure well and was taken to the recovery room in stable condition.

## 2017-01-21 NOTE — Anesthesia Preprocedure Evaluation (Signed)
Anesthesia Evaluation    History of Anesthesia Complications (+) PROLONGED EMERGENCE and history of anesthetic complications  Airway Mallampati: II  TM Distance: >3 FB     Dental  (+) Teeth Intact   Pulmonary Current Smoker,    Pulmonary exam normal        Cardiovascular negative cardio ROS Normal cardiovascular exam     Neuro/Psych PSYCHIATRIC DISORDERS Depression    GI/Hepatic Neg liver ROS, GERD  ,  Endo/Other  negative endocrine ROS  Renal/GU negative Renal ROS  negative genitourinary   Musculoskeletal negative musculoskeletal ROS (+)   Abdominal Normal abdominal exam  (+)   Peds negative pediatric ROS (+)  Hematology negative hematology ROS (+)   Anesthesia Other Findings   Reproductive/Obstetrics                             Anesthesia Physical Anesthesia Plan  ASA: II  Anesthesia Plan: General   Post-op Pain Management:    Induction: Intravenous  PONV Risk Score and Plan:   Airway Management Planned: Oral ETT  Additional Equipment:   Intra-op Plan:   Post-operative Plan: Extubation in OR  Informed Consent: I have reviewed the patients History and Physical, chart, labs and discussed the procedure including the risks, benefits and alternatives for the proposed anesthesia with the patient or authorized representative who has indicated his/her understanding and acceptance.   Dental advisory given  Plan Discussed with: CRNA and Surgeon  Anesthesia Plan Comments:         Anesthesia Quick Evaluation

## 2017-01-21 NOTE — Discharge Instructions (Signed)

## 2017-01-21 NOTE — Anesthesia Procedure Notes (Signed)
Procedure Name: Intubation Date/Time: 01/21/2017 1:30 PM Performed by: Allean Found Pre-anesthesia Checklist: Patient identified, Emergency Drugs available, Suction available, Patient being monitored and Timeout performed Patient Re-evaluated:Patient Re-evaluated prior to induction Oxygen Delivery Method: Circle system utilized Preoxygenation: Pre-oxygenation with 100% oxygen Induction Type: IV induction Ventilation: Mask ventilation without difficulty Laryngoscope Size: Mac and 3 Grade View: Grade I Tube type: Oral Tube size: 7.0 mm Number of attempts: 1 Placement Confirmation: ETT inserted through vocal cords under direct vision,  positive ETCO2 and breath sounds checked- equal and bilateral Secured at: 21 cm Tube secured with: Tape Dental Injury: Teeth and Oropharynx as per pre-operative assessment

## 2017-01-21 NOTE — Transfer of Care (Signed)
Immediate Anesthesia Transfer of Care Note  Patient: Shannon Little  Procedure(s) Performed: Procedure(s): LAPAROSCOPIC TUBAL LIGATION (Bilateral)  Patient Location: PACU  Anesthesia Type:General  Level of Consciousness: awake  Airway & Oxygen Therapy: Patient Spontanous Breathing and Patient connected to face mask oxygen  Post-op Assessment: Report given to RN and Post -op Vital signs reviewed and stable  Post vital signs: Reviewed and stable  Last Vitals:  Vitals:   01/21/17 1117  BP: 108/64  Pulse: (!) 58  Resp: 16  Temp: (!) 36.3 C  SpO2: 100%    Last Pain:  Vitals:   01/21/17 1117  TempSrc: Tympanic         Complications: No apparent anesthesia complications

## 2017-01-23 ENCOUNTER — Telehealth: Payer: Self-pay

## 2017-01-23 NOTE — Telephone Encounter (Signed)
AMS is on call, message forwarded to him, Please advise

## 2017-01-23 NOTE — Telephone Encounter (Signed)
Pharmacist at [facility] contacted after hours triage service stating that the medication patient was given exceeds the limits on an opioid restriction and needs to clarify the amount on the medication.   AMS performed BTL on 01/21/17.

## 2017-01-24 LAB — SURGICAL PATHOLOGY

## 2017-01-30 NOTE — Progress Notes (Signed)
      Postoperative Follow-up Patient presents post op from laparoscopic bilateral tubal ligation 1weeks ago for desired sterility  Subjective: Patient reports marked improvement in her preop symptoms. Eating a regular diet without difficulty. The patient is not having any pain.  Activity: normal activities of daily living.  Objective: There were no vitals filed for this visit.  General: NAD Abdomen: soft, non-tender, non-distended, incision D/C/I Ext: no edema  Assessment: 31 y.o. s/p laparoscopic BTL stable  Plan: Patient has done well after surgery with no apparent complications.  I have discussed the post-operative course to date, and the expected progress moving forward.  The patient understands what complications to be concerned about.  I will see the patient in routine follow up, or sooner if needed.    Activity plan: No restriction.  Malachy Mood 01/30/2017, 10:35 PM

## 2017-01-31 ENCOUNTER — Ambulatory Visit (INDEPENDENT_AMBULATORY_CARE_PROVIDER_SITE_OTHER): Payer: BLUE CROSS/BLUE SHIELD | Admitting: Obstetrics and Gynecology

## 2017-01-31 ENCOUNTER — Encounter: Payer: Self-pay | Admitting: Obstetrics and Gynecology

## 2017-01-31 VITALS — BP 110/62 | HR 90 | Wt 120.0 lb

## 2017-01-31 DIAGNOSIS — Z4889 Encounter for other specified surgical aftercare: Secondary | ICD-10-CM

## 2017-01-31 MED ORDER — SERTRALINE HCL 50 MG PO TABS: 50.0000 mg | ORAL_TABLET | Freq: Every day | ORAL | 11 refills | Status: DC

## 2017-02-04 ENCOUNTER — Encounter: Payer: Self-pay | Admitting: General Surgery

## 2017-02-04 ENCOUNTER — Ambulatory Visit (INDEPENDENT_AMBULATORY_CARE_PROVIDER_SITE_OTHER): Payer: BLUE CROSS/BLUE SHIELD | Admitting: General Surgery

## 2017-02-04 VITALS — BP 112/66 | HR 84 | Resp 12 | Ht 63.0 in | Wt 117.0 lb

## 2017-02-04 DIAGNOSIS — K645 Perianal venous thrombosis: Secondary | ICD-10-CM

## 2017-02-04 NOTE — Progress Notes (Addendum)
Patient ID: Shannon Little, female   DOB: 1986/01/07, 31 y.o.   MRN: [id number]  Chief Complaint  Patient presents with  . Rectal Problems    HPI Shannon Little is a 31 y.o. female.  Here today for evaluation of hemorrhoid pain. She is postpartum 3 months. Bowels moving regular. She started back to work today. Pain with BM today was like "razor blade". She was seen in 2016 similar issues.   HPI  Past Medical History:  Diagnosis Date  . Cervical dysplasia   . Complication of anesthesia    HARD TO WAKE UP AFTER D & C  . Depression   . GERD (gastroesophageal reflux disease)    H/O WITH PREGNANCY    Past Surgical History:  Procedure Laterality Date  . BREAST ENHANCEMENT SURGERY    . DILATION AND CURETTAGE OF UTERUS    . LAPAROSCOPIC TUBAL LIGATION Bilateral 01/21/2017   Procedure: LAPAROSCOPIC TUBAL LIGATION;  Surgeon: Malachy Mood, MD;  Location: [facility] ORS;  Service: Gynecology;  Laterality: Bilateral;  . LEEP      Family History  Problem Relation Age of Onset  . Cancer Paternal Grandmother        ovarian    Social History Social History  Substance Use Topics  . Smoking status: Current Every Day Smoker    Packs/day: 0.50    Years: 13.00  . Smokeless tobacco: Never Used  . Alcohol use No    No Known Allergies  Current Outpatient Prescriptions  Medication Sig Dispense Refill  . ibuprofen (ADVIL,MOTRIN) 600 MG tablet Take 1 tablet (600 mg total) by mouth every 6 (six) hours as needed for moderate pain or cramping. 30 tablet 0  . Multiple Vitamin (MULTIVITAMIN WITH MINERALS) TABS tablet Take 1 tablet by mouth daily.    Marland Kitchen PROCTO-MED HC 2.5 % rectal cream Place 1 application rectally as needed for hemorrhoids.     . sertraline (ZOLOFT) 50 MG tablet Take 1 tablet (50 mg total) by mouth daily. 30 tablet 11   No current facility-administered medications for this visit.     Review of Systems Review of Systems  Constitutional: Negative.   Respiratory: Negative.    Cardiovascular: Negative.   Gastrointestinal: Positive for rectal pain. Negative for anal bleeding.    Blood pressure 112/66, pulse 84, resp. rate 12, height 5\' 3"  (1.6 m), weight 117 lb (53.1 kg), last menstrual period 01/07/2017, not currently breastfeeding.  Physical Exam Physical Exam  Constitutional: She is oriented to person, place, and time. She appears well-developed and well-nourished.  Neurological: She is alert and oriented to person, place, and time.  Skin: Skin is warm and dry.  Rectal exam- 2.5cm inflamed and thrombosed external hemorrhoid at 3 ocl location.  Data Reviewed Prior notes reviewed   Assessment    Thrombosed external hemorrhoid    Plan   With consent the hemorrhoid was lanced. Area prepped with betadine. 1 ml 1% xlyocaine instilled. Radial incision made Several clots removed measurion up to 1cm. Size of hemorrhoid reduced more than 50%  Patient to return in three weeks.  No work restrictions. Patient aware to avoid constipation. Continue with local hemorrhoid cream    HPI, Physical Exam, Assessment and Plan have been scribed under the direction and in the presence of Mckinley Jewel, MD  Shannon Little, CMA I have completed the exam and reviewed the above documentation for accuracy and completeness.  I agree with the above.  Haematologist has been used and any errors in dictation or transcription  are unintentional.  Oden Lindaman G. Jamal Collin, M.D., F.A.C.S.   Junie Panning G 02/04/2017, 6:57 PM

## 2017-02-04 NOTE — Patient Instructions (Signed)
Return in three weeks.. 

## 2017-02-25 ENCOUNTER — Ambulatory Visit: Payer: BLUE CROSS/BLUE SHIELD | Admitting: General Surgery

## 2017-03-05 ENCOUNTER — Ambulatory Visit: Payer: BLUE CROSS/BLUE SHIELD | Admitting: Obstetrics and Gynecology

## 2017-04-15 ENCOUNTER — Encounter: Payer: Self-pay | Admitting: *Deleted

## 2017-11-13 ENCOUNTER — Encounter: Payer: Self-pay | Admitting: Obstetrics and Gynecology

## 2017-11-13 ENCOUNTER — Ambulatory Visit (INDEPENDENT_AMBULATORY_CARE_PROVIDER_SITE_OTHER): Payer: BLUE CROSS/BLUE SHIELD | Admitting: Obstetrics and Gynecology

## 2017-11-13 ENCOUNTER — Other Ambulatory Visit: Payer: Self-pay

## 2017-11-13 VITALS — BP 106/64 | HR 75 | Ht 63.0 in | Wt 112.0 lb

## 2017-11-13 DIAGNOSIS — R635 Abnormal weight gain: Secondary | ICD-10-CM

## 2017-11-13 DIAGNOSIS — F329 Major depressive disorder, single episode, unspecified: Secondary | ICD-10-CM | POA: Diagnosis not present

## 2017-11-13 DIAGNOSIS — F419 Anxiety disorder, unspecified: Secondary | ICD-10-CM

## 2017-11-13 DIAGNOSIS — R102 Pelvic and perineal pain unspecified side: Secondary | ICD-10-CM

## 2017-11-13 DIAGNOSIS — Z113 Encounter for screening for infections with a predominantly sexual mode of transmission: Secondary | ICD-10-CM | POA: Diagnosis not present

## 2017-11-13 DIAGNOSIS — Z0000 Encounter for general adult medical examination without abnormal findings: Secondary | ICD-10-CM

## 2017-11-13 DIAGNOSIS — F32A Depression, unspecified: Secondary | ICD-10-CM

## 2017-11-13 NOTE — Progress Notes (Signed)
Gynecology Pelvic Pain Evaluation   Chief Complaint:  Chief Complaint  Patient presents with  . Gynecologic Exam    Pain using Tampons since last delivery & still having issue w/hemorrhoids & skin tags    History of Present Illness:   Patient is a 32 y.o. [id number] who LMP was Patient's last menstrual period was 10/31/2017., presents today for a problem visit.  She complains of vaginal pain with tampon insertion since last delivery 2018.   Her pain is localized to the vaginal area (deep not introitus) area, described as cramping, began several months ago and its severity is described as moderate. The pain is non-radiating. She has these associated symptoms which include menses. Patient has these modifiers which include nothing that make it better and nothing that make it worse.  She does not report pain with insertion just when wearing the tampon.  She has tried switching to a smaller size tampon.  She does not report dysparunia during intercourse but sometime has discomfort afterwards.  There was no evidence of endometriosis at the time of her laparoscopic tubal ligation 01/21/2017.    In addition she has multiple broad based flat skin tags on her perineum that she is interested in having removed.   Review of Systems: ROS  Past Medical History:  Past Medical History:  Diagnosis Date  . Cervical dysplasia   . Complication of anesthesia    HARD TO WAKE UP AFTER D & C  . Depression   . GERD (gastroesophageal reflux disease)    H/O WITH PREGNANCY    Past Surgical History:  Past Surgical History:  Procedure Laterality Date  . BREAST ENHANCEMENT SURGERY    . DILATION AND CURETTAGE OF UTERUS    . LAPAROSCOPIC TUBAL LIGATION Bilateral 01/21/2017   Procedure: LAPAROSCOPIC TUBAL LIGATION;  Surgeon: Malachy Mood, MD;  Location: [facility] ORS;  Service: Gynecology;  Laterality: Bilateral;  . LEEP      Gynecologic History:  Patient's last menstrual period was 10/31/2017.   Obstetric  History: [id number]  Family History:  Family History  Problem Relation Age of Onset  . Cancer Paternal Grandmother        ovarian    Social History:  Social History   Socioeconomic History  . Marital status: Married    Spouse name: Not on file  . Number of children: Not on file  . Years of education: Not on file  . Highest education level: Not on file  Occupational History  . Not on file  Social Needs  . Financial resource strain: Not on file  . Food insecurity:    Worry: Not on file    Inability: Not on file  . Transportation needs:    Medical: Not on file    Non-medical: Not on file  Tobacco Use  . Smoking status: Current Every Day Smoker    Packs/day: 0.50    Years: 13.00    Pack years: 6.50  . Smokeless tobacco: Never Used  Substance and Sexual Activity  . Alcohol use: No    Alcohol/week: 0.0 oz  . Drug use: Yes    Types: Marijuana    Comment: UDS + FOR MARIJUANA ON 10-31-16  . Sexual activity: Yes  Lifestyle  . Physical activity:    Days per week: Not on file    Minutes per session: Not on file  . Stress: Not on file  Relationships  . Social connections:    Talks on phone: Not on file    Gets  together: Not on file    Attends religious service: Not on file    Active member of club or organization: Not on file    Attends meetings of clubs or organizations: Not on file    Relationship status: Not on file  . Intimate partner violence:    Fear of current or ex partner: Not on file    Emotionally abused: Not on file    Physically abused: Not on file    Forced sexual activity: Not on file  Other Topics Concern  . Not on file  Social History Narrative  . Not on file    Allergies:  No Known Allergies  Medications: Prior to Admission medications   Medication Sig Start Date End Date Taking? Authorizing Provider  ibuprofen (ADVIL,MOTRIN) 600 MG tablet Take 1 tablet (600 mg total) by mouth every 6 (six) hours as needed for moderate pain or cramping. 01/21/17   Yes Malachy Mood, MD  Multiple Vitamin (MULTIVITAMIN WITH MINERALS) TABS tablet Take 1 tablet by mouth daily.   Yes [provider]  PROCTO-MED HC 2.5 % rectal cream Place 1 application rectally as needed for hemorrhoids.  09/09/16  Yes [provider]  sertraline (ZOLOFT) 50 MG tablet Take 1 tablet (50 mg total) by mouth daily. Patient not taking: Reported on 11/13/2017 01/31/17 01/31/18  Malachy Mood, MD    Physical Exam Vitals: Blood pressure 106/64, pulse 75, height 5\' 3"  (1.6 m), weight 112 lb (50.8 kg), last menstrual period 10/31/2017, not currently breastfeeding.  General: NAD HEENT: normocephalic, anicteric Pulmonary: No increased work of breathing Genitourinary:  External: Normal external female genitalia.  Normal urethral meatus, normal  Bartholin's and Skene's glands.  Negative cotton Q-tip test for vulvodynia.  Several small broad based flat skin tags on perineum    Vagina: Normal vaginal mucosa, no evidence of prolapse.    Cervix: Grossly normal in appearance, no bleeding  Uterus: Non-enlarged, mobile, normal contour.  No CMT  Adnexa: ovaries non-enlarged, no adnexal masses  Rectal: deferred  Lymphatic: no evidence of inguinal lymphadenopathy Extremities: no edema, erythema, or tenderness Neurologic: Grossly intact Psychiatric: mood appropriate, affect full  Female chaperone present for pelvic portion of the physical exam  Assessment: 32 y.o. [id number] with vaginal pain with tampon use  Problem List Items Addressed This Visit    None    Visit Diagnoses    Anxiety and depression    -  Primary   Relevant Orders   TSH   Weight gain       Relevant Orders   TSH   Vaginal pain       Relevant Orders   NuSwab VG+, Candida 6sp   Routine screening for STI (sexually transmitted infection)       Relevant Orders   NuSwab VG+, Candida 6sp   Encounter for medical examination to establish care       Relevant Orders   Ambulatory referral to Family  Practice       1) We discussed the possible etiologies for pelvic pain in women.  Gynecologic causes may include endometriosis, adenomyosis, pelvic inflammatory disease (PID), ovarian cysts, ovarian or tubal torsion, and in rare case gynecologic malignancy such as cervical, uterine, or ovarian cancer.  In addition thee possibility of non-gynecologic etiologies such as urinary or GI tract pathology or disordered, as well as musculoskeletal problems.  The goal is to complete a basic work up in hopes of identifying the underlying cause which in turn will dictate treatment.  In the meantime  supportive measures such as localized heat, and NSAIDs are reasonable first steps.     - Prescription drug database was not reviewed, UDS was not ordered - Transvaginal ultrasound not ordered given recent laparoscopy - Blood work obtained today Yes  - Cervical cultures No - Skin tags - hyfrecator next week - TSH for weight gain and anxiety referral to PCP to establish care - We discussed cycle suppression would also be an option as symptoms are present with menses   2) Return in about 1 week (around 11/20/2017) for 1-2 week hyfrecator (20 min).   Malachy Mood, MD, [facility], [facility] 11/13/2017, 2:05 PM

## 2017-11-14 LAB — TSH: TSH: 0.163 u[IU]/mL — AB (ref 0.450–4.500)

## 2017-11-15 LAB — T4, FREE: Free T4: 1.41 ng/dL (ref 0.82–1.77)

## 2017-11-15 LAB — SPECIMEN STATUS REPORT

## 2017-11-17 LAB — NUSWAB VG+, CANDIDA 6SP
Atopobium vaginae: HIGH Score — AB
BVAB 2: HIGH {score} — AB
CANDIDA LUSITANIAE, NAA: NEGATIVE
CANDIDA TROPICALIS, NAA: NEGATIVE
Candida albicans, NAA: NEGATIVE
Candida glabrata, NAA: NEGATIVE
Candida krusei, NAA: NEGATIVE
Candida parapsilosis, NAA: NEGATIVE
Chlamydia trachomatis, NAA: NEGATIVE
Neisseria gonorrhoeae, NAA: NEGATIVE
TRICH VAG BY NAA: POSITIVE — AB

## 2017-11-21 ENCOUNTER — Encounter: Payer: Self-pay | Admitting: Obstetrics and Gynecology

## 2017-11-21 ENCOUNTER — Ambulatory Visit (INDEPENDENT_AMBULATORY_CARE_PROVIDER_SITE_OTHER): Payer: BLUE CROSS/BLUE SHIELD | Admitting: Obstetrics and Gynecology

## 2017-11-21 VITALS — BP 100/68 | HR 64 | Wt 115.0 lb

## 2017-11-21 DIAGNOSIS — A599 Trichomoniasis, unspecified: Secondary | ICD-10-CM

## 2017-11-21 DIAGNOSIS — Z113 Encounter for screening for infections with a predominantly sexual mode of transmission: Secondary | ICD-10-CM

## 2017-11-21 DIAGNOSIS — L918 Other hypertrophic disorders of the skin: Secondary | ICD-10-CM | POA: Diagnosis not present

## 2017-11-21 MED ORDER — METRONIDAZOLE 500 MG PO TABS: 2000.0000 mg | ORAL_TABLET | Freq: Once | ORAL | 0 refills | Status: AC

## 2017-11-21 MED ORDER — SERTRALINE HCL 50 MG PO TABS: 50.0000 mg | ORAL_TABLET | Freq: Every day | ORAL | 11 refills | Status: DC

## 2017-11-22 LAB — HEP, RPR, HIV PANEL
HIV SCREEN 4TH GENERATION: NONREACTIVE
Hepatitis B Surface Ag: NEGATIVE
RPR Ser Ql: NONREACTIVE

## 2017-11-22 NOTE — Progress Notes (Signed)
Obstetrics & Gynecology Office Visit   Chief Complaint:  Chief Complaint  Patient presents with  . Hyfrecator    History of Present Illness:Shannon Little is a 32 y.o. woman who presents to for skin tag removal.  However, at the time of last visit she was presenting with some vulvar and vaginal pain.  During that work up cultures were obtained which returned positive for trichomonas.  She has not other symptoms.  She is in a monogamous relationship as far as she is concerned.  However, she has recently had concerns about her husband.  Discussed that trichomonas is and STI, and that infection with one STI increased the risk of co-infection with others.  We discussed additional STI testing which the patient accepted.  GC/CT were negative.    Review of Systems: 10 point review of systems negative unless otherwise noted in HPI  Past Medical History:  Past Medical History:  Diagnosis Date  . Cervical dysplasia   . Complication of anesthesia    HARD TO WAKE UP AFTER D & C  . Depression   . GERD (gastroesophageal reflux disease)    H/O WITH PREGNANCY    Past Surgical History:  Past Surgical History:  Procedure Laterality Date  . BREAST ENHANCEMENT SURGERY    . DILATION AND CURETTAGE OF UTERUS    . LAPAROSCOPIC TUBAL LIGATION Bilateral 01/21/2017   Procedure: LAPAROSCOPIC TUBAL LIGATION;  Surgeon: Shannon Mood, MD;  Location: [facility] ORS;  Service: Gynecology;  Laterality: Bilateral;  . LEEP      Gynecologic History: Patient's last menstrual period was 10/31/2017.  Obstetric History: [id number]  Family History:  Family History  Problem Relation Age of Onset  . Cancer Paternal Grandmother        ovarian    Social History:  Social History   Socioeconomic History  . Marital status: Married    Spouse name: Not on file  . Number of children: Not on file  . Years of education: Not on file  . Highest education level: Not on file  Occupational History  . Not on file    Social Needs  . Financial resource strain: Not on file  . Food insecurity:    Worry: Not on file    Inability: Not on file  . Transportation needs:    Medical: Not on file    Non-medical: Not on file  Tobacco Use  . Smoking status: Current Every Day Smoker    Packs/day: 0.50    Years: 13.00    Pack years: 6.50  . Smokeless tobacco: Never Used  Substance and Sexual Activity  . Alcohol use: No    Alcohol/week: 0.0 oz  . Drug use: Yes    Types: Marijuana    Comment: UDS + FOR MARIJUANA ON 10-31-16  . Sexual activity: Yes  Lifestyle  . Physical activity:    Days per week: Not on file    Minutes per session: Not on file  . Stress: Not on file  Relationships  . Social connections:    Talks on phone: Not on file    Gets together: Not on file    Attends religious service: Not on file    Active member of club or organization: Not on file    Attends meetings of clubs or organizations: Not on file    Relationship status: Not on file  . Intimate partner violence:    Fear of current or ex partner: Not on file    Emotionally  abused: Not on file    Physically abused: Not on file    Forced sexual activity: Not on file  Other Topics Concern  . Not on file  Social History Narrative  . Not on file    Allergies:  No Known Allergies  Medications: Prior to Admission medications   Medication Sig Start Date End Date Taking? Authorizing Provider  ibuprofen (ADVIL,MOTRIN) 600 MG tablet Take 1 tablet (600 mg total) by mouth every 6 (six) hours as needed for moderate pain or cramping. 01/21/17   Shannon Mood, MD  Multiple Vitamin (MULTIVITAMIN WITH MINERALS) TABS tablet Take 1 tablet by mouth daily.    [provider]  PROCTO-MED HC 2.5 % rectal cream Place 1 application rectally as needed for hemorrhoids.  09/09/16   [provider]  sertraline (ZOLOFT) 50 MG tablet Take 1 tablet (50 mg total) by mouth daily. 11/21/17 11/21/18  Shannon Mood, MD    Physical  Exam Vitals:  Vitals:   11/21/17 1016  BP: 100/68  Pulse: 64   Patient's last menstrual period was 10/31/2017.  General: NAD HEENT: normocephalic, anicteric Pulmonary: No increased work of breathing Genitourinary:  External: Normal external female genitalia.  Normal urethral meatus, normal Bartholin's and Skene's glands.  Extremities: no edema, erythema, or tenderness Neurologic: Grossly intact Psychiatric: Little appropriate, affect full  Female chaperone present for pelvic and breast  portions of the physical exam  Hyfrecation Acrochordon(s)   The indications, mechanism, and complication of Hyfrecation were reviewed.   Risksincluding pain, bleeding, infection, and localized irritation were discussed. The patient stated understanding and agreed to undergo procedure today. Tme out performed.  The patient's vulva was prepped with Betadine, a total of 23mL of 1% lidocaine was injected underneath the 12 Acrochordons noted. The hyfrecator was set at low, 2.5, with a sharp tip electrode.  Lesions were treated until completely hyfercated, with several pedunculated lesion being excised using the hyfrecator an the base of the stalk hyfrecated to prevent recurrence.  The patient tolerated the procedure well. Post-procedure instructions  were given to the patient. TAssessment: 32 y.o. [name] follow up for abnormal vaginal culture as well as destruction of multiple acrochordons   Plan: Problem List Items Addressed This Visit    None    Visit Diagnoses    Routine screening for STI (sexually transmitted infection)    -  Primary   Relevant Orders   HEP, RPR, HIV Panel (Completed)   Acrochordon       Trichomoniasis          - Additional STI testing offered and accepted   - I had a lengthly discussion with Hilario Quarry  regarding the cause of trichomonas, it mode of transmission as well as implications..Generally trichomonas infections have little symptoms other than discharge.  There  appears to be no long lasting adverse health effects and treatment is course of metronidazole.  However, infection with any STI increases the risk of infections with other STI's.  - discussed expected recovery time from hyfrecation.  Use of barrier creams such as desatin or petroleum Jelly for any local irriitation post procedure  - She is comfortable with the plan and had her questions answered.  - Return in about 1 year (around 11/22/2018) for annual.   Shannon Mood, MD, [place], [place]

## 2017-12-25 ENCOUNTER — Ambulatory Visit: Payer: Self-pay | Admitting: Physician Assistant

## 2017-12-25 ENCOUNTER — Ambulatory Visit: Payer: BLUE CROSS/BLUE SHIELD | Admitting: Physician Assistant

## 2017-12-25 ENCOUNTER — Encounter: Payer: Self-pay | Admitting: Physician Assistant

## 2017-12-25 VITALS — BP 112/64 | HR 56 | Temp 98.6°F | Resp 16 | Ht 63.0 in | Wt 112.0 lb

## 2017-12-25 DIAGNOSIS — E010 Iodine-deficiency related diffuse (endemic) goiter: Secondary | ICD-10-CM | POA: Diagnosis not present

## 2017-12-25 DIAGNOSIS — F419 Anxiety disorder, unspecified: Secondary | ICD-10-CM

## 2017-12-25 DIAGNOSIS — R7989 Other specified abnormal findings of blood chemistry: Secondary | ICD-10-CM

## 2017-12-25 DIAGNOSIS — R634 Abnormal weight loss: Secondary | ICD-10-CM

## 2017-12-25 MED ORDER — ESCITALOPRAM OXALATE 10 MG PO TABS: 10.0000 mg | ORAL_TABLET | Freq: Every day | ORAL | 0 refills | Status: DC

## 2017-12-25 NOTE — Progress Notes (Signed)
Patient: Shannon Little Female    DOB: Mar 17, 1986   32 y.o.   MRN: [id number] Visit Date: 12/25/2017  Today's Provider: Trinna Post, PA-C   Chief Complaint  Patient presents with  . Establish Care  . Hyperthyroidism  . Anxiety   Subjective:    Shannon Little is a 32 y/o woman presenting today to establish care. She is seen by Dr. Georgianne Fick at [facility] for [facility] care, but has not had a PCP. She is married with 5 children: Stepdaughter age 46 and 16 age 75; son age 23, daughter age 66, and youngest daughter aged 23. She works at [employer] in [employer] and also at [employer]   She reports ongoing and significant anxiety. She had been started on zoloft 50 mg one year ago and took this for two months. She self discontinued because she felt it wasn't working. She recently restarted it 6 weeks ago after her OBGYN appointment but still feels it is not working. Has never tried anything else. She is interested in therapy.  Her TSH approximately 6 weeks ago was low at 0.163. Her free T4 was 1.41. She reports an approximate 10 lb unintentional weight loss. She says she normally stays clammy. She does endorse some throat clearing, though she attributed this to smoking.    Thyroid Problem  Presents for initial visit. Symptoms include anxiety, cold intolerance, constipation, depressed mood and heat intolerance. Patient reports no diarrhea, fatigue, hair loss, nail problem or palpitations.  Anxiety  Presents for initial visit. The problem has been gradually worsening. Symptoms include depressed mood, excessive worry and nervous/anxious behavior. Patient reports no chest pain, compulsions, confusion, decreased concentration, dizziness, dry mouth, feeling of choking, insomnia, irritability, nausea, palpitations, panic, restlessness, shortness of breath or suicidal ideas. The quality of sleep is good. Nighttime awakenings: several.     No Known Allergies   Current  Outpatient Medications:  Marland Kitchen  Multiple Vitamin (MULTIVITAMIN WITH MINERALS) TABS tablet, Take 1 tablet by mouth daily., Disp: , Rfl:  .  PROCTO-MED HC 2.5 % rectal cream, Place 1 application rectally as needed for hemorrhoids. , Disp: , Rfl:  .  sertraline (ZOLOFT) 50 MG tablet, Take 1 tablet (50 mg total) by mouth daily., Disp: 30 tablet, Rfl: 11 .  ibuprofen (ADVIL,MOTRIN) 600 MG tablet, Take 1 tablet (600 mg total) by mouth every 6 (six) hours as needed for moderate pain or cramping., Disp: 30 tablet, Rfl: 0  Review of Systems  Constitutional: Negative.  Negative for fatigue and irritability.  Respiratory: Negative for shortness of breath.   Cardiovascular: Negative for chest pain and palpitations.  Gastrointestinal: Positive for constipation. Negative for abdominal distention, abdominal pain, anal bleeding, blood in stool, diarrhea, nausea, rectal pain and vomiting.  Endocrine: Positive for cold intolerance and heat intolerance.  Neurological: Negative for dizziness.  Psychiatric/Behavioral: Positive for dysphoric mood. Negative for agitation, behavioral problems, confusion, decreased concentration, hallucinations, self-injury, sleep disturbance and suicidal ideas. The patient is nervous/anxious. The patient does not have insomnia and is not hyperactive.     Social History   Tobacco Use  . Smoking status: Current Every Day Smoker    Packs/day: 0.50    Years: 13.00    Pack years: 6.50  . Smokeless tobacco: Never Used  Substance Use Topics  . Alcohol use: No    Alcohol/week: 0.0 oz   Objective:   BP 112/64 (BP Location: Right Arm, Patient Position: Sitting, Cuff Size: Normal)   Pulse (!) 56  Temp 98.6 F (37 C) (Oral)   Resp 16   Ht 5\' 3"  (1.6 m)   Wt 112 lb (50.8 kg)   BMI 19.84 kg/m  Vitals:   12/25/17 1511  BP: 112/64  Pulse: (!) 56  Resp: 16  Temp: 98.6 F (37 C)  TempSrc: Oral  Weight: 112 lb (50.8 kg)  Height: 5\' 3"  (1.6 m)     Physical Exam  Constitutional:  She is oriented to person, place, and time. She appears well-developed and well-nourished.  HENT:  Right Ear: External ear normal.  Left Ear: External ear normal.  Mouth/Throat: Oropharynx is clear and moist. No oropharyngeal exudate.  Eyes: Conjunctivae are normal. Right eye exhibits no discharge. Left eye exhibits no discharge.  Neck: Neck supple. Thyromegaly present.  There is a goiter present.   Cardiovascular: Normal rate and regular rhythm.  Pulmonary/Chest: Effort normal and breath sounds normal.  Lymphadenopathy:    She has no cervical adenopathy.  Neurological: She is alert and oriented to person, place, and time.  Skin: Skin is warm and dry.  Psychiatric: She has a normal mood and affect. Her behavior is normal.        Assessment & Plan:     1. Anxiety  Will change zoloft to Lexapro and see her back in 6 weeks. Will refer for therapy and counseling.  - Ambulatory referral to Psychology - escitalopram (LEXAPRO) 10 MG tablet; Take 1 tablet (10 mg total) by mouth daily.  Dispense: 90 tablet; Refill: 0  2. Weight loss   3. Low TSH level  Last TSH was low with normal T4, will repeat this. She endorses unintentional weight loss. She does appear to have a goiter in the clinic and I will get an ultrasound to further assess. May need to refer to endocrinology.   - TSH+T4F+T3Free - US Soft Tissue Head/Neck; Future  4. Thyromegaly  - US Soft Tissue Head/Neck; Future  Return in about 6 weeks (around 02/05/2018) for anxiety .  The entirety of the information documented in the History of Present Illness, Review of Systems and Physical Exam were personally obtained by me. Portions of this information were initially documented by Ashley Royalty, CMA and reviewed by me for thoroughness and accuracy.        Trinna Post, PA-C  [facility]

## 2017-12-25 NOTE — Patient Instructions (Signed)
Hyperthyroidism Hyperthyroidism is when the thyroid is too active (overactive). Your thyroid is a large gland that is located in your neck. The thyroid helps to control how your body uses food (metabolism). When your thyroid is overactive, it produces too much of a hormone called thyroxine. What are the causes? Causes of hyperthyroidism may include:  Graves disease. This is when your immune system attacks the thyroid gland. This is the most common cause.  Inflammation of the thyroid gland.  Tumor in the thyroid gland or somewhere else.  Excessive use of thyroid medicines, including: ? Prescription thyroid supplement. ? Herbal supplements that mimic thyroid hormones.  Solid or fluid-filled lumps within your thyroid gland (thyroid nodules).  Excessive ingestion of iodine.  What increases the risk?  Being female.  Having a family history of thyroid conditions. What are the signs or symptoms? Signs and symptoms of hyperthyroidism may include:  Nervousness.  Inability to tolerate heat.  Unexplained weight loss.  Diarrhea.  Change in the texture of hair or skin.  Heart skipping beats or making extra beats.  Rapid heart rate.  Loss of menstruation.  Shaky hands.  Fatigue.  Restlessness.  Increased appetite.  Sleep problems.  Enlarged thyroid gland or nodules.  How is this diagnosed? Diagnosis of hyperthyroidism may include:  Medical history and physical exam.  Blood tests.  Ultrasound tests.  How is this treated? Treatment may include:  Medicines to control your thyroid.  Surgery to remove your thyroid.  Radiation therapy.  Follow these instructions at home:  Take medicines only as directed by your health care provider.  Do not use any tobacco products, including cigarettes, chewing tobacco, or electronic cigarettes. If you need help quitting, ask your health care provider.  Do not exercise or do physical activity until your health care provider  approves.  Keep all follow-up appointments as directed by your health care provider. This is important. Contact a health care provider if:  Your symptoms do not get better with treatment.  You have fever.  You are taking thyroid replacement medicine and you: ? Have depression. ? Feel mentally and physically slow. ? Have weight gain. Get help right away if:  You have decreased alertness or a change in your awareness.  You have abdominal pain.  You feel dizzy.  You have a rapid heartbeat.  You have an irregular heartbeat. This information is not intended to replace advice given to you by your health care provider. Make sure you discuss any questions you have with your health care provider. Document Released: 05/27/2005 Document Revised: 10/26/2015 Document Reviewed: 10/12/2013 Elsevier Interactive Patient Education  2018 Elsevier Inc.  

## 2017-12-26 LAB — TSH+T4F+T3FREE
Free T4: 1.28 ng/dL (ref 0.82–1.77)
T3, Free: 2.9 pg/mL (ref 2.0–4.4)
TSH: 0.178 u[IU]/mL — ABNORMAL LOW (ref 0.450–4.500)

## 2017-12-29 ENCOUNTER — Telehealth: Payer: Self-pay

## 2017-12-29 NOTE — Telephone Encounter (Signed)
LMTCB 12/29/2017  Thanks,   -Mickel Baas

## 2017-12-29 NOTE — Telephone Encounter (Signed)
-----   Message from Trinna Post, [place] sent at 12/29/2017  4:03 PM EDT ----- TSH remains low but her T3 and T4 are normal. This trends in the direction of an over active thyroid. The ultrasound is scheduled for tomorrow, and after we get that we can further direct her.

## 2017-12-30 ENCOUNTER — Ambulatory Visit: Admission: RE | Admit: 2017-12-30 | Payer: BLUE CROSS/BLUE SHIELD | Source: Ambulatory Visit

## 2017-12-30 ENCOUNTER — Telehealth: Payer: Self-pay | Admitting: Physician Assistant

## 2017-12-30 DIAGNOSIS — I788 Other diseases of capillaries: Secondary | ICD-10-CM | POA: Diagnosis not present

## 2017-12-30 DIAGNOSIS — B360 Pityriasis versicolor: Secondary | ICD-10-CM | POA: Diagnosis not present

## 2017-12-30 DIAGNOSIS — L814 Other melanin hyperpigmentation: Secondary | ICD-10-CM | POA: Diagnosis not present

## 2017-12-30 DIAGNOSIS — L309 Dermatitis, unspecified: Secondary | ICD-10-CM | POA: Diagnosis not present

## 2017-12-30 NOTE — Telephone Encounter (Signed)
Colletta Maryland with imaging wanted to let Fabio Bering know that pt didn't show up for her 4 pm appt today for thyroid US. Thanks TNP

## 2017-12-30 NOTE — Telephone Encounter (Signed)
Pt advised.   Thanks,   -Floella Ensz  

## 2017-12-31 NOTE — Telephone Encounter (Signed)
Can we call this patient? Really would like her to get this ultrasound?

## 2018-02-06 ENCOUNTER — Ambulatory Visit: Payer: BLUE CROSS/BLUE SHIELD | Admitting: Physician Assistant

## 2018-02-06 ENCOUNTER — Telehealth: Payer: Self-pay | Admitting: Physician Assistant

## 2018-02-06 NOTE — Telephone Encounter (Signed)
Called this patient about missing her ultrasound. No answer, LM. She needs to get this. Please call her.

## 2018-02-10 ENCOUNTER — Telehealth: Payer: Self-pay | Admitting: Physician Assistant

## 2018-02-10 NOTE — Telephone Encounter (Signed)
Pt cancelled thyroid ultrasound that was ordered 12/25/17 and has not rescheduled

## 2018-02-10 NOTE — Telephone Encounter (Signed)
Please see other note.Provider tried to contact this patient.

## 2018-02-11 NOTE — Telephone Encounter (Signed)
LMTCB

## 2018-02-16 NOTE — Telephone Encounter (Signed)
lmtcb

## 2018-02-19 NOTE — Telephone Encounter (Signed)
NA

## 2018-07-28 ENCOUNTER — Other Ambulatory Visit (HOSPITAL_COMMUNITY)
Admission: RE | Admit: 2018-07-28 | Discharge: 2018-07-28 | Disposition: A | Discharge status: Home / Self Care | Payer: BLUE CROSS/BLUE SHIELD | Source: Ambulatory Visit | Attending: Obstetrics and Gynecology | Admitting: Obstetrics and Gynecology

## 2018-07-28 ENCOUNTER — Encounter: Payer: Self-pay | Admitting: Obstetrics and Gynecology

## 2018-07-28 ENCOUNTER — Ambulatory Visit: Payer: BLUE CROSS/BLUE SHIELD | Admitting: Obstetrics and Gynecology

## 2018-07-28 VITALS — BP 128/68 | HR 70 | Wt 113.0 lb

## 2018-07-28 DIAGNOSIS — Z124 Encounter for screening for malignant neoplasm of cervix: Secondary | ICD-10-CM

## 2018-07-28 DIAGNOSIS — Z113 Encounter for screening for infections with a predominantly sexual mode of transmission: Secondary | ICD-10-CM | POA: Insufficient documentation

## 2018-07-28 DIAGNOSIS — N939 Abnormal uterine and vaginal bleeding, unspecified: Secondary | ICD-10-CM

## 2018-07-28 MED ORDER — MEDROXYPROGESTERONE ACETATE 10 MG PO TABS: 10.0000 mg | ORAL_TABLET | Freq: Every day | ORAL | 0 refills | Status: DC

## 2018-07-28 NOTE — Progress Notes (Signed)
Gynecology Abnormal Uterine Bleeding Initial Evaluation   Chief Complaint:  Chief Complaint  Patient presents with  . Metrorrhagia    heavy w/clots, sm. cramping    History of Present Illness:    Shannon Little is a 33 y.o. [id number] who LMP was Patient's last menstrual period was 07/14/2018 (exact date)., presents today for a problem visit.  She complains of menorrhagia that  began 2 weeks ago and its severity is described as moderate.  The patient menstrual complaints are acute.  She has regular periods every month prior to this episode and they are associated with mild menstrual cramping.  . The patient is sexually active. She currently uses tubal ligationfor contraception.  Last Pap results esults were obtained 2016 were normal  Previous evaluation: none Prior Diagnosis: none. Previous Treatment: none.  LMP: Patient's last menstrual period was 07/14/2018 (exact date).  Paramter Normal / Abnormal Prsent  Frequency [place]     Infrequent (>38 days)     Normal (?24 days ?38 days) X   Freequent (<24 days)    Duration Normal (?8 days)     Prolonged (>8 days) X  Regularity Regular (shortest to longest cycle variation ?7-9 days)* X   Irregular (shortest to longest cycle variation ?8-10days)*    Flow Volume Light    (Self reported) Normal     Heavy X      Intermenstrual Bleeding None X   Random     Cyclical early     Cyclical mid     Cyclical late        Unscheduled Bleeding  Not applicable X  (exogenous hormones) Absent     Present     FIGO AUB I System: *The available evidence suggests that, using these criteria, the normal range (shortest to longest) varies with age: 3-25 y of age, ?23 d; 60-41 y, ?7 d; and for 41-45 y, ?9 d    Review of Systems: Review of Systems  Constitutional: Negative.   Gastrointestinal: Negative.   Genitourinary: Negative.   Skin: Negative.   Neurological: Negative.   Endo/Heme/Allergies: Does not bruise/bleed easily.    Past Medical  History:  Past Medical History:  Diagnosis Date  . Cervical dysplasia   . Complication of anesthesia    HARD TO WAKE UP AFTER D & C  . Depression   . GERD (gastroesophageal reflux disease)    H/O WITH PREGNANCY    Past Surgical History:  Past Surgical History:  Procedure Laterality Date  . BREAST ENHANCEMENT SURGERY    . DILATION AND CURETTAGE OF UTERUS    . LAPAROSCOPIC TUBAL LIGATION Bilateral 01/21/2017   Procedure: LAPAROSCOPIC TUBAL LIGATION;  Surgeon: Malachy Mood, MD;  Location: [facility] ORS;  Service: Gynecology;  Laterality: Bilateral;  . LEEP      Obstetric History: [id number]  Family History:  Family History  Problem Relation Age of Onset  . Cancer Paternal Grandmother        ovarian  . Healthy Mother   . COPD Father     Social History:  Social History   Socioeconomic History  . Marital status: Married    Spouse name: Not on file  . Number of children: Not on file  . Years of education: Not on file  . Highest education level: Not on file  Occupational History  . Not on file  Social Needs  . Financial resource strain: Not on file  . Food insecurity:    Worry: Not on file    Inability:  Not on file  . Transportation needs:    Medical: Not on file    Non-medical: Not on file  Tobacco Use  . Smoking status: Current Every Day Smoker    Packs/day: 0.50    Years: 13.00    Pack years: 6.50  . Smokeless tobacco: Never Used  Substance and Sexual Activity  . Alcohol use: No    Alcohol/week: 0.0 standard drinks  . Drug use: Yes    Types: Marijuana    Comment: UDS + FOR MARIJUANA ON 10-31-16  . Sexual activity: Yes  Lifestyle  . Physical activity:    Days per week: Not on file    Minutes per session: Not on file  . Stress: Not on file  Relationships  . Social connections:    Talks on phone: Not on file    Gets together: Not on file    Attends religious service: Not on file    Active member of club or organization: Not on file    Attends meetings of  clubs or organizations: Not on file    Relationship status: Not on file  . Intimate partner violence:    Fear of current or ex partner: Not on file    Emotionally abused: Not on file    Physically abused: Not on file    Forced sexual activity: Not on file  Other Topics Concern  . Not on file  Social History Narrative  . Not on file    Allergies:  No Known Allergies  Medications: Prior to Admission medications   Medication Sig Start Date End Date Taking? Authorizing Provider  escitalopram (LEXAPRO) 10 MG tablet Take 1 tablet (10 mg total) by mouth daily. 12/25/17 03/25/18  Trinna Post, PA-C  ibuprofen (ADVIL,MOTRIN) 600 MG tablet Take 1 tablet (600 mg total) by mouth every 6 (six) hours as needed for moderate pain or cramping. Patient not taking: Reported on 07/28/2018 01/21/17   Malachy Mood, MD  medroxyPROGESTERone (PROVERA) 10 MG tablet Take 1 tablet (10 mg total) by mouth daily for 10 doses. 07/28/18 08/07/18  Malachy Mood, MD  Multiple Vitamin (MULTIVITAMIN WITH MINERALS) TABS tablet Take 1 tablet by mouth daily.    [provider]  PROCTO-MED HC 2.5 % rectal cream Place 1 application rectally as needed for hemorrhoids.  09/09/16   [provider]    Physical Exam Blood pressure 128/68, pulse 70, weight 113 lb (51.3 kg), last menstrual period 07/14/2018, not currently breastfeeding. Body mass index is 20.02 kg/m.  Patient's last menstrual period was 07/14/2018 (exact date).  General: NAD HEENT: normocephalic, anicteric Pulmonary: No increased work of breathing Abdomen: NABS, soft, non-tender, non-distended.  Umbilicus without lesions.  No hepatomegaly, splenomegaly or masses palpable. No evidence of hernia  Genitourinary:  External: Normal external female genitalia.  Normal urethral meatus, normal Bartholin's and Skene's glands.    Vagina: Normal vaginal mucosa, no evidence of prolapse.    Cervix: Grossly normal in appearance, light bleeding  present  Uterus: Non-enlarged, mobile, normal contour.  No CMT  Adnexa: ovaries non-enlarged, no adnexal masses  Rectal: deferred  Lymphatic: no evidence of inguinal lymphadenopathy Extremities: no edema, erythema, or tenderness Neurologic: Grossly intact Psychiatric: mood appropriate, affect full  Female chaperone present for pelvic portions of the physical exam  Assessment: 33 y.o. [id number] with abnormal uterine bleeding  Plan: Problem List Items Addressed This Visit    None    Visit Diagnoses    Abnormal uterine bleeding    -  Primary  Relevant Orders   TSH+Prl+FSH+TestT+LH+DHEA S...   US PELVIS TRANSVANGINAL NON-OB (TV ONLY)   CBC   T4, free   Routine screening for STI (sexually transmitted infection)       Relevant Orders   Cytology, thin prep pap (cervical)   Screening for malignant neoplasm of cervix       Relevant Orders   Cytology, thin prep pap (cervical)      1) Discussed management options for abnormal uterine bleeding including expectant, NSAIDs, tranexamic acid (Lysteda), oral progesterone (Provera, norethindrone, megace), Depo Provera, Levonorgestrel containing IUD, endometrial ablation (Novasure) or hysterectomy as definitive surgical management.  Discussed risks and benefits of each method.   Final management decision will hinge on results of patient's work up and whether an underlying etiology for the patients bleeding symptoms can be discerned.  We will conduct a basic work up examining using the PALM-COIEN classification system.  In the meantime the patient opts to trial provera while we await results of her ultrasound and labs.  The role of unopposed estrogen in the development of endometrial hyperplasia or carcinoma is discussed.  The risk of endometrial hyperplasia is linearly correlated with increasing BMI given the production of estrone by adipose tissue. Printed patient education handouts were given to the patient to review at home.  Bleeding precautions  reviewed.  - Labs and Ultrasound for AUB ordered - Pap and cervical cultures - Suspect AUB-O discussed hormonal  - 10 day course of provera - BMI is normal but low overall body fat. Weight stable over the last year   2) Return in about 1 week (around 08/04/2018) for TVUS and follow up.   Malachy Mood, MD, [facility], [place]

## 2018-07-29 ENCOUNTER — Telehealth: Payer: Self-pay | Admitting: Obstetrics and Gynecology

## 2018-07-29 NOTE — Telephone Encounter (Signed)
In one week per AMS patient needs to be seen for labs and brought back with in three days for Ultrasound and follow up with Him. Called and left voicemail for patient to call back to be schedule

## 2018-07-30 ENCOUNTER — Other Ambulatory Visit: Payer: BLUE CROSS/BLUE SHIELD

## 2018-07-30 DIAGNOSIS — N939 Abnormal uterine and vaginal bleeding, unspecified: Secondary | ICD-10-CM | POA: Diagnosis not present

## 2018-08-01 LAB — T4, FREE: FREE T4: 1.21 ng/dL (ref 0.82–1.77)

## 2018-08-01 LAB — SPECIMEN STATUS REPORT

## 2018-08-03 ENCOUNTER — Ambulatory Visit (INDEPENDENT_AMBULATORY_CARE_PROVIDER_SITE_OTHER): Payer: BLUE CROSS/BLUE SHIELD

## 2018-08-03 ENCOUNTER — Ambulatory Visit (INDEPENDENT_AMBULATORY_CARE_PROVIDER_SITE_OTHER): Payer: BLUE CROSS/BLUE SHIELD | Admitting: Obstetrics and Gynecology

## 2018-08-03 ENCOUNTER — Encounter: Payer: Self-pay | Admitting: Obstetrics and Gynecology

## 2018-08-03 VITALS — BP 104/66 | HR 71 | Wt 115.0 lb

## 2018-08-03 DIAGNOSIS — N939 Abnormal uterine and vaginal bleeding, unspecified: Secondary | ICD-10-CM | POA: Diagnosis not present

## 2018-08-03 DIAGNOSIS — F419 Anxiety disorder, unspecified: Secondary | ICD-10-CM | POA: Diagnosis not present

## 2018-08-03 LAB — CYTOLOGY - PAP
CHLAMYDIA, DNA PROBE: NEGATIVE
DIAGNOSIS: UNDETERMINED — AB
HPV 16/18/45 GENOTYPING: NEGATIVE
HPV: DETECTED — AB
NEISSERIA GONORRHEA: NEGATIVE
Trichomonas: NEGATIVE

## 2018-08-03 MED ORDER — ESCITALOPRAM OXALATE 10 MG PO TABS: 10.0000 mg | ORAL_TABLET | Freq: Every day | ORAL | 0 refills | Status: DC

## 2018-08-03 NOTE — Progress Notes (Signed)
Gynecology Ultrasound Follow Up  Chief Complaint:  Chief Complaint  Patient presents with  . Follow-up    GYN Ultrasound     History of Present Illness: Patient is a 33 y.o. female who presents today for ultrasound evaluation of AUB .  Ultrasound demonstrates the following findgins Adnexa: no masses seen Uterus: Non-enlarged, no firboids with endometrial stripe  3mm, no focal abnormalities  Additional: no free fluid  Has stopped bleeding on Provera  Review of Systems: Review of Systems  Constitutional: Negative.   Gastrointestinal: Negative.   Skin: Negative.   Psychiatric/Behavioral: Positive for depression and memory loss. Negative for hallucinations, substance abuse and suicidal ideas. The patient is nervous/anxious and has insomnia.     Past Medical History:  Past Medical History:  Diagnosis Date  . Cervical dysplasia   . Complication of anesthesia    HARD TO WAKE UP AFTER D & C  . Depression   . GERD (gastroesophageal reflux disease)    H/O WITH PREGNANCY    Past Surgical History:  Past Surgical History:  Procedure Laterality Date  . BREAST ENHANCEMENT SURGERY    . DILATION AND CURETTAGE OF UTERUS    . LAPAROSCOPIC TUBAL LIGATION Bilateral 01/21/2017   Procedure: LAPAROSCOPIC TUBAL LIGATION;  Surgeon: Malachy Mood, MD;  Location: [facility] ORS;  Service: Gynecology;  Laterality: Bilateral;  . LEEP      Gynecologic History:  Patient's last menstrual period was 07/14/2018 (exact date).  Family History:  Family History  Problem Relation Age of Onset  . Cancer Paternal Grandmother        ovarian  . Healthy Mother   . COPD Father     Social History:  Social History   Socioeconomic History  . Marital status: Married    Spouse name: Not on file  . Number of children: Not on file  . Years of education: Not on file  . Highest education level: Not on file  Occupational History  . Not on file  Social Needs  . Financial resource strain: Not on file    . Food insecurity:    Worry: Not on file    Inability: Not on file  . Transportation needs:    Medical: Not on file    Non-medical: Not on file  Tobacco Use  . Smoking status: Current Every Day Smoker    Packs/day: 0.50    Years: 13.00    Pack years: 6.50  . Smokeless tobacco: Never Used  Substance and Sexual Activity  . Alcohol use: No    Alcohol/week: 0.0 standard drinks  . Drug use: Yes    Types: Marijuana    Comment: UDS + FOR MARIJUANA ON 10-31-16  . Sexual activity: Yes  Lifestyle  . Physical activity:    Days per week: Not on file    Minutes per session: Not on file  . Stress: Not on file  Relationships  . Social connections:    Talks on phone: Not on file    Gets together: Not on file    Attends religious service: Not on file    Active member of club or organization: Not on file    Attends meetings of clubs or organizations: Not on file    Relationship status: Not on file  . Intimate partner violence:    Fear of current or ex partner: Not on file    Emotionally abused: Not on file    Physically abused: Not on file    Forced sexual activity: Not  on file  Other Topics Concern  . Not on file  Social History Narrative  . Not on file    Allergies:  No Known Allergies  Medications: Prior to Admission medications   Medication Sig Start Date End Date Taking? Authorizing Provider  escitalopram (LEXAPRO) 10 MG tablet Take 1 tablet (10 mg total) by mouth daily. 12/25/17 03/25/18  Trinna Post, PA-C  ibuprofen (ADVIL,MOTRIN) 600 MG tablet Take 1 tablet (600 mg total) by mouth every 6 (six) hours as needed for moderate pain or cramping. Patient not taking: Reported on 07/28/2018 01/21/17   Malachy Mood, MD  medroxyPROGESTERone (PROVERA) 10 MG tablet Take 1 tablet (10 mg total) by mouth daily for 10 doses. 07/28/18 08/07/18  Malachy Mood, MD  Multiple Vitamin (MULTIVITAMIN WITH MINERALS) TABS tablet Take 1 tablet by mouth daily.    [provider]   PROCTO-MED HC 2.5 % rectal cream Place 1 application rectally as needed for hemorrhoids.  09/09/16   [provider]    Physical Exam Vitals: Blood pressure 104/66, pulse 71, weight 115 lb (52.2 kg), last menstrual period 07/14/2018, not currently breastfeeding.  General: NAD HEENT: normocephalic, anicteric Pulmonary: No increased work of breathing Extremities: no edema, erythema, or tenderness Neurologic: Grossly intact, normal gait Psychiatric: mood appropriate, affect full  US Pelvis Transvanginal Non-ob (tv Only)  Result Date: 08/03/2018 ULTRASOUND REPORT Location: [facility] Date of Service: 08/03/2018 Patient Name: Shannon Little DOB: 1985-10-19 MRN: [id number] Indications:AUB Findings: The uterus is anteverted and measures 7.00 x 5.11 x 3.54 cm. Echo texture is homogenous without evidence of focal masses. The Endometrium measures 3.81 mm. Right Ovary measures 3.32 x 2.68 x 1.43 cm. It is normal in appearance. Left Ovary measures 3.57 x 2.35 x 2.07 cm. It is normal in appearance. Survey of the adnexa demonstrates no adnexal masses. There is no free fluid in the cul de sac. Impression: 1. Anteverted uterus with homogenous texture. 2. No free fluid or abnormalities are seen. Recommendations: 1.Clinical correlation with the patient's History and Physical Exam. Mital bahen Marlowe Sax, RDMS Images reviewed.  Normal GYN study without visualized pathology.  Malachy Mood, MD, [facility], [facility] 08/03/2018, 11:46 AM     Assessment: 33 y.o. [id number] follow up AUB, depression/anxiety Plan: Problem List Items Addressed This Visit    None      1) AUB  - [place] today normal - Pap and labs pending - Stopped bleeding on provera, will see if normal menses following provera withdrawal bleed - discussed option for cycle control if next menses still heavy or prolonged interested in Depo Provera in that setting (s/p BTL)  2) Low energy - previously discussed  depression and anxiety with patient.  Not good at taking pills but I encouraged her to restart Lexapro  3) A total of 15 minutes were spent in face-to-face contact with the patient during this encounter with over half of that time devoted to counseling and coordination of care.  4) Return in about 6 weeks (around 09/14/2018) for medication follow up.    Malachy Mood, MD, [facility], [facility] 08/03/2018, 12:04 PM

## 2018-08-06 LAB — CBC
HEMATOCRIT: 43.9 % (ref 34.0–46.6)
HEMOGLOBIN: 14.7 g/dL (ref 11.1–15.9)
MCH: 33.3 pg — AB (ref 26.6–33.0)
MCHC: 33.5 g/dL (ref 31.5–35.7)
MCV: 100 fL — AB (ref 79–97)
Platelets: 376 10*3/uL (ref 150–450)
RBC: 4.41 x10E6/uL (ref 3.77–5.28)
RDW: 11.6 % — ABNORMAL LOW (ref 11.7–15.4)
WBC: 6.3 10*3/uL (ref 3.4–10.8)

## 2018-08-06 LAB — TSH+PRL+FSH+TESTT+LH+DHEA S...
17-Hydroxyprogesterone: 54 ng/dL
Androstenedione: 139 ng/dL (ref 41–262)
DHEA-SO4: 141.7 ug/dL (ref 84.8–378.0)
FSH: 8.6 m[IU]/mL
LH: 5.9 m[IU]/mL
Prolactin: 7.5 ng/mL (ref 4.8–23.3)
TESTOSTERONE FREE: 1.9 pg/mL (ref 0.0–4.2)
TESTOSTERONE: 15 ng/dL (ref 8–48)
TSH: 0.27 u[IU]/mL — ABNORMAL LOW (ref 0.450–4.500)

## 2018-08-20 ENCOUNTER — Telehealth: Payer: Self-pay | Admitting: Obstetrics and Gynecology

## 2018-08-20 NOTE — Telephone Encounter (Signed)
Patient is schedule for 09/14/18 at 10:30

## 2018-08-20 NOTE — Telephone Encounter (Signed)
-----   Message from Malachy Mood, MD sent at 08/19/2018  4:34 PM EDT ----- Regarding: Colposcopy Colposcopy 1-2 weeks

## 2018-09-14 ENCOUNTER — Ambulatory Visit: Payer: BLUE CROSS/BLUE SHIELD | Admitting: Obstetrics and Gynecology

## 2019-08-17 ENCOUNTER — Other Ambulatory Visit: Payer: Self-pay

## 2019-08-17 ENCOUNTER — Ambulatory Visit (INDEPENDENT_AMBULATORY_CARE_PROVIDER_SITE_OTHER): Payer: 59 | Admitting: Primary Care

## 2019-08-17 ENCOUNTER — Encounter: Payer: Self-pay | Admitting: Primary Care

## 2019-08-17 ENCOUNTER — Ambulatory Visit (INDEPENDENT_AMBULATORY_CARE_PROVIDER_SITE_OTHER)
Admission: RE | Admit: 2019-08-17 | Discharge: 2019-08-17 | Disposition: A | Discharge status: Home / Self Care | Payer: 59 | Source: Ambulatory Visit | Attending: Primary Care | Admitting: Primary Care

## 2019-08-17 VITALS — BP 116/76 | HR 88 | Temp 97.4°F | Ht 63.0 in | Wt 107.8 lb

## 2019-08-17 DIAGNOSIS — F331 Major depressive disorder, recurrent, moderate: Secondary | ICD-10-CM | POA: Diagnosis not present

## 2019-08-17 DIAGNOSIS — R7989 Other specified abnormal findings of blood chemistry: Secondary | ICD-10-CM | POA: Diagnosis not present

## 2019-08-17 DIAGNOSIS — F411 Generalized anxiety disorder: Secondary | ICD-10-CM

## 2019-08-17 DIAGNOSIS — M5442 Lumbago with sciatica, left side: Secondary | ICD-10-CM

## 2019-08-17 DIAGNOSIS — E0590 Thyrotoxicosis, unspecified without thyrotoxic crisis or storm: Secondary | ICD-10-CM | POA: Insufficient documentation

## 2019-08-17 DIAGNOSIS — G8929 Other chronic pain: Secondary | ICD-10-CM | POA: Insufficient documentation

## 2019-08-17 LAB — COMPREHENSIVE METABOLIC PANEL
ALT: 9 U/L (ref 0–35)
AST: 13 U/L (ref 0–37)
Albumin: 4.2 g/dL (ref 3.5–5.2)
Alkaline Phosphatase: 63 U/L (ref 39–117)
BUN: 8 mg/dL (ref 6–23)
CO2: 31 mEq/L (ref 19–32)
Calcium: 9.4 mg/dL (ref 8.4–10.5)
Chloride: 103 mEq/L (ref 96–112)
Creatinine, Ser: 0.7 mg/dL (ref 0.40–1.20)
GFR: 95.29 mL/min (ref >60.00)
Glucose, Bld: 94 mg/dL (ref 70–99)
Potassium: 4.3 mEq/L (ref 3.5–5.1)
Sodium: 137 mEq/L (ref 135–145)
Total Bilirubin: 0.6 mg/dL (ref 0.2–1.2)
Total Protein: 6.6 g/dL (ref 6.0–8.3)

## 2019-08-17 LAB — T4, FREE: Free T4: 0.98 ng/dL (ref 0.60–1.60)

## 2019-08-17 LAB — TSH: TSH: 0.22 u[IU]/mL — ABNORMAL LOW (ref 0.35–4.50)

## 2019-08-17 MED ORDER — ESCITALOPRAM OXALATE 10 MG PO TABS: 10.0000 mg | ORAL_TABLET | Freq: Every day | ORAL | 0 refills | Status: DC

## 2019-08-17 MED ORDER — CYCLOBENZAPRINE HCL 5 MG PO TABS: 5.0000 mg | ORAL_TABLET | Freq: Three times a day (TID) | ORAL | 0 refills | Status: DC | PRN

## 2019-08-17 MED ORDER — PREDNISONE 20 MG PO TABS
ORAL_TABLET | ORAL | 0 refills | Status: DC
Start: 2019-08-17 — End: 2019-09-06

## 2019-08-17 NOTE — Patient Instructions (Addendum)
Complete xray(s) and labs prior to leaving today. I will notify you of your results once received.  Start the prednisone steroid medication. Take 2 tablets daily for four days, then 1 tablet daily for four days.  You may take the cyclobenzaprine muscle relaxer medication every 8 hours as needed, this may cause drowsiness.  You will be contacted regarding your referral to therapy.  Please let us know if you have not been contacted within two weeks.   Start escitalopram (Lexapro) once daily for anxiety and depression. Do not start this until you have finished the prednisone. Start with 1/2 tablet daily for 6 days, then increase to 1 full tablet thereafter.  Please schedule a follow up visit for 6 weeks for anxiety and depression.  It was a pleasure to meet you today! Please don't hesitate to call or message me with any questions. Welcome to [facility]!

## 2019-08-17 NOTE — Assessment & Plan Note (Signed)
Evident on labs from February 2020. Repeat TSH and Free T4 pending.

## 2019-08-17 NOTE — Progress Notes (Signed)
Subjective:    Patient ID: Shannon Little, female    DOB: April 22, 1986, 34 y.o.   MRN: [id number]  HPI  This visit occurred during the SARS-CoV-2 public health emergency.  Safety protocols were in place, including screening questions prior to the visit, additional usage of staff PPE, and extensive cleaning of exam room while observing appropriate contact time as indicated for disinfecting solutions.   Shannon Little is a 34 year old female who presents today to establish care and discuss the problems mentioned below. Will obtain/review records.  1) Depression/Anxiety: Chronic which began about 4-5 years ago, post-partum. Previously managed on Lexapro, Zoloft, bupropion, Ambien at various times. She never really stuck with any treatment as she has trouble remembering to take medications. She feels like she needs to restart treatment and is ready to take consistently.   Symptoms include tearfulness, feeling anxious, feeling down/depressed, increased over the last several months, worse since her back pain in mid February 2021.  GAD 7 score of 19 and PHQ 9 score of 19 today.   BP Readings from Last 3 Encounters:  08/17/19 116/76  08/03/18 104/66  07/28/18 128/68   2) Abnormal TSH: TSH of 0.270 in February 2020, Free T4. She has not had this rechecked. She denies a family history of thyroid disorders.   3) Chronic Back Pain: She's been following with chiropractic services since December 2020, had no symptoms at the time but really just wanted a "check up". She underwent xrays in December 2020, wasn't given specific information regarding her xray. She went back in January 2021 and had an "allignment", still had no symptoms.   Mid February 2021 she started noticed left lower back pain with radiation of pain down her left lower extremity down to her toes. She has numbness and tingling to her feet and toes, weakness to her left lower extremity, cannot lift her leg past 90 degrees while sitting due to  pulling and pain sensation. She saw the chiropractor several days after her pain began and was told that she had a "bulging" disc that was compressing her sciatic nerve. She was put on a "table" and had a mild adjustment but didn't feel as though she was treated effectively.   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 She does some heavy lifiting but doesn't recall a specific incident that would cause this amount of pain. She's been doing hot soaks, stretches, using the TENS machine without improvement. She's also taken tylenol and ibuprofen without improvement.   She denies foot drop, loss of bowel/bladder control.   Review of Systems  Respiratory: Negative for shortness of breath.   Cardiovascular: Negative for chest pain.  Musculoskeletal: Positive for back pain.  Neurological: Positive for numbness.  Psychiatric/Behavioral:       See HPI       Past Medical History:  Diagnosis Date  . Cervical dysplasia   . Complication of anesthesia    HARD TO WAKE UP AFTER D & C  . Depression   . GERD (gastroesophageal reflux disease)    H/O WITH PREGNANCY     Social History   Socioeconomic History  . Marital status: Married    Spouse name: Not on file  . Number of children: Not on file  . Years of education: Not on file  . Highest education level: Not on file  Occupational History  . Not on file  Tobacco Use  . Smoking status: Current Every Day Smoker  Packs/day: 0.50    Years: 13.00    Pack years: 6.50  . Smokeless tobacco: Never Used  Substance and Sexual Activity  . Alcohol use: No    Alcohol/week: 0.0 standard drinks  . Drug use: Yes    Types: Marijuana    Comment: UDS + FOR MARIJUANA ON 10-31-16  . Sexual activity: Yes  Other Topics Concern  . Not on file  Social History Narrative  . Not on file   Social Determinants of Health   Financial Resource Strain:   . Difficulty of Paying Living Expenses: Not on file  Food Insecurity:   .  Worried About Charity fundraiser in the Last Year: Not on file  . Ran Out of Food in the Last Year: Not on file  Transportation Needs:   . Lack of Transportation (Medical): Not on file  . Lack of Transportation (Non-Medical): Not on file  Physical Activity:   . Days of Exercise per Week: Not on file  . Minutes of Exercise per Session: Not on file  Stress:   . Feeling of Stress : Not on file  Social Connections:   . Frequency of Communication with Friends and Family: Not on file  . Frequency of Social Gatherings with Friends and Family: Not on file  . Attends Religious Services: Not on file  . Active Member of Clubs or Organizations: Not on file  . Attends Archivist Meetings: Not on file  . Marital Status: Not on file  Intimate Partner Violence:   . Fear of Current or Ex-Partner: Not on file  . Emotionally Abused: Not on file  . Physically Abused: Not on file  . Sexually Abused: Not on file    Past Surgical History:  Procedure Laterality Date  . BREAST ENHANCEMENT SURGERY    . DILATION AND CURETTAGE OF UTERUS    . LAPAROSCOPIC TUBAL LIGATION Bilateral 01/21/2017   Procedure: LAPAROSCOPIC TUBAL LIGATION;  Surgeon: Malachy Mood, MD;  Location: [facility] ORS;  Service: Gynecology;  Laterality: Bilateral;  . LEEP      Family History  Problem Relation Age of Onset  . Cancer Paternal Grandmother        ovarian  . Healthy Mother   . COPD Father     No Known Allergies  No current outpatient medications on file prior to visit.   No current facility-administered medications on file prior to visit.    BP 116/76   Pulse 88   Temp (!) 97.4 F (36.3 C) (Temporal)   Ht 5\' 3"  (1.6 m)   Wt 107 lb 12 oz (48.9 kg)   LMP 08/01/2019   SpO2 98%   BMI 19.09 kg/m    Objective:   Physical Exam  Constitutional: She appears well-nourished.  Appears uncomfortable sitting in the exam room chair.  Cardiovascular: Normal rate and regular rhythm.  Respiratory: Effort normal  and breath sounds normal.  Musculoskeletal:     Cervical back: Neck supple.     Comments: Ambulates well in clinic, was uncomfortable sitting in the chair. Negative straight leg raise bilaterally. 4/5 strength to left lower extremity compared to right which is 5/5.  Skin: Skin is warm and dry.  Psychiatric: She has a normal mood and affect.  Tearful today, mostly due to discomfort with her left lower back and extremity.           Assessment & Plan:

## 2019-08-17 NOTE — Assessment & Plan Note (Signed)
Chronic with increased symptoms recently. GAD 7 score of 19 and PHQ 9 score of 19 today.   Discussed options for treatment, she opts for both therapy and medication. She denies any difficulty with lexapro in the past.   Referral placed to therapy. Rx for Lexapro 10 mg sent to pharmacy. Patient is to take 1/2 tablet daily for 6 days, then advance to 1 full tablet thereafter. We discussed possible side effects of headache, GI upset, drowsiness, and SI/HI. If thoughts of SI/HI develop, we discussed to present to the emergency immediately. Patient verbalized understanding.   We discussed to hold starting Lexapro until she completes the prednisone. She understood. Follow up in 6 weeks for re-evaluation.

## 2019-08-17 NOTE — Assessment & Plan Note (Signed)
Acute since February 2021, no trauma or known injury per patient.  Exam overall stable, but she does appear uncomfortable. Repeat plain films today.  Rx for prednisone course sent to pharmacy.  Rx for cyclobenzaprine sent to pharmacy.   Urgent referral placed for PT. She will update.  Consider MRI if needed.

## 2019-08-18 ENCOUNTER — Other Ambulatory Visit: Payer: Self-pay | Admitting: Primary Care

## 2019-08-18 DIAGNOSIS — M5442 Lumbago with sciatica, left side: Secondary | ICD-10-CM

## 2019-08-18 DIAGNOSIS — R7989 Other specified abnormal findings of blood chemistry: Secondary | ICD-10-CM

## 2019-09-06 ENCOUNTER — Other Ambulatory Visit: Payer: Self-pay

## 2019-09-06 ENCOUNTER — Ambulatory Visit (INDEPENDENT_AMBULATORY_CARE_PROVIDER_SITE_OTHER): Payer: 59 | Admitting: Primary Care

## 2019-09-06 ENCOUNTER — Encounter: Payer: Self-pay | Admitting: Primary Care

## 2019-09-06 DIAGNOSIS — Z720 Tobacco use: Secondary | ICD-10-CM | POA: Diagnosis not present

## 2019-09-06 DIAGNOSIS — M5442 Lumbago with sciatica, left side: Secondary | ICD-10-CM

## 2019-09-06 MED ORDER — CHANTIX STARTING MONTH PAK 0.5 MG X 11 & 1 MG X 42 PO TABS: ORAL_TABLET | ORAL | 0 refills | Status: DC

## 2019-09-06 MED ORDER — VARENICLINE TARTRATE 1 MG PO TABS: 1.0000 mg | ORAL_TABLET | Freq: Two times a day (BID) | ORAL | 0 refills | Status: DC

## 2019-09-06 NOTE — Progress Notes (Signed)
Subjective:    Patient ID: Shannon Little, female    DOB: May 06, 1986, 34 y.o.   MRN: [id number]  HPI  This visit occurred during the SARS-CoV-2 public health emergency.  Safety protocols were in place, including screening questions prior to the visit, additional usage of staff PPE, and extensive cleaning of exam room while observing appropriate contact time as indicated for disinfecting solutions.   Shannon Little is a 34 year old female with a history of MDD, GAD, acute back pain who presents today for follow up of back pain. She would also like to discuss tobacco cessation.   1) Acute Back Pain: She was last evaluated as a new patient on 08/17/19 with acute back pain since chiropractic adjustment in January 2021. Symptoms included left lower back pain with radiation of pain and numbness to left lower extremity. During her last visit we treated her with prednisone and cyclobenzaprine and referred her to PT. Xray of the lumbar spine was negative.   Since her last visit she's feeling slight improvement in her pain. She continues to notice numbness/tingling, burning sensation to the left lower extremity. Most of her discomfort is to the ankle and foot, tight socks and shoes make it worse. She was released by PT last week as she hasn't noticed any improvement in her symptoms.   The foot always feels cold, she is concerned about a vascular cause. She smokes 1 PPD and has done so for the last 10 years.   2) Tobacco Abuse: Ready to quit. Currently smoking 1 PPD, has been smoking for 20 years. She was prescribed Wellbutrin during pregnancy, never took the medication. She did take one week's worth of Chantix 10 years ago, not sure if she had a bad reaction. She's tried OTC patches, using APP's on her phone, etc without improvement.   Review of Systems  Genitourinary:       No loss of bowel/bladder control  Musculoskeletal: Positive for back pain.  Neurological: Positive for weakness and numbness.       Past Medical History:  Diagnosis Date  . Cervical dysplasia   . Complication of anesthesia    HARD TO WAKE UP AFTER D & C  . Depression   . GERD (gastroesophageal reflux disease)    H/O WITH PREGNANCY  . Precipitous delivery 10/31/2016     Social History   Socioeconomic History  . Marital status: Married    Spouse name: Not on file  . Number of children: Not on file  . Years of education: Not on file  . Highest education level: Not on file  Occupational History  . Not on file  Tobacco Use  . Smoking status: Current Every Day Smoker    Packs/day: 0.50    Years: 13.00    Pack years: 6.50  . Smokeless tobacco: Never Used  Substance and Sexual Activity  . Alcohol use: No    Alcohol/week: 0.0 standard drinks  . Drug use: Yes    Types: Marijuana    Comment: UDS + FOR MARIJUANA ON 10-31-16  . Sexual activity: Yes  Other Topics Concern  . Not on file  Social History Narrative  . Not on file   Social Determinants of Health   Financial Resource Strain:   . Difficulty of Paying Living Expenses:   Food Insecurity:   . Worried About Charity fundraiser in the Last Year:   . Arboriculturist in the Last Year:   Transportation Needs:   . Lack  of Transportation (Medical):   Marland Kitchen Lack of Transportation (Non-Medical):   Physical Activity:   . Days of Exercise per Week:   . Minutes of Exercise per Session:   Stress:   . Feeling of Stress :   Social Connections:   . Frequency of Communication with Friends and Family:   . Frequency of Social Gatherings with Friends and Family:   . Attends Religious Services:   . Active Member of Clubs or Organizations:   . Attends Archivist Meetings:   Marland Kitchen Marital Status:   Intimate Partner Violence:   . Fear of Current or Ex-Partner:   . Emotionally Abused:   Marland Kitchen Physically Abused:   . Sexually Abused:     Past Surgical History:  Procedure Laterality Date  . BREAST ENHANCEMENT SURGERY    . DILATION AND CURETTAGE OF UTERUS    .  LAPAROSCOPIC TUBAL LIGATION Bilateral 01/21/2017   Procedure: LAPAROSCOPIC TUBAL LIGATION;  Surgeon: Malachy Mood, MD;  Location: [facility] ORS;  Service: Gynecology;  Laterality: Bilateral;  . LEEP      Family History  Problem Relation Age of Onset  . Cancer Paternal Grandmother        ovarian  . Healthy Mother   . COPD Father     No Known Allergies  Current Outpatient Medications on File Prior to Visit  Medication Sig Dispense Refill  . cyclobenzaprine (FLEXERIL) 5 MG tablet Take 1 tablet (5 mg total) by mouth 3 (three) times daily as needed for muscle spasms. 15 tablet 0  . escitalopram (LEXAPRO) 10 MG tablet Take 1 tablet (10 mg total) by mouth daily. For anxiety and depression 90 tablet 0   No current facility-administered medications on file prior to visit.    BP 100/72   Pulse 94   Temp 98.2 F (36.8 C) (Temporal)   Ht 5\' 3"  (1.6 m)   Wt 105 lb (47.6 kg)   LMP 09/06/2019   SpO2 99%   BMI 18.60 kg/m    Objective:   Physical Exam  Constitutional: She appears well-nourished.  Cardiovascular:  Pulses:      Dorsalis pedis pulses are 2+ on the right side and 2+ on the left side.       Posterior tibial pulses are 2+ on the right side and 2+ on the left side.  Respiratory: Effort normal.  Musculoskeletal:     Cervical back: Neck supple.     Lumbar back: Pain present. No tenderness. Decreased range of motion.       Back:     Comments: 4/5 strength to left lower extremity  Skin: Skin is warm and dry.  Psychiatric: She has a normal mood and affect.           Assessment & Plan:

## 2019-09-06 NOTE — Patient Instructions (Signed)
You will be contacted regarding your MRI.  Please let us know if you have not been contacted within two weeks.   Start with the Chantix starting pack. Choose a quit date before you start, the quit date must be within the first week. Fill the continuing pack once you finish the starting pack.  Please schedule a physical and follow up with me in 3-4 weeks.  It was a pleasure to see you today!

## 2019-09-06 NOTE — Assessment & Plan Note (Signed)
Smoker for 20 years, 1 PPD x 10 years, ready to quit. Discussed options for treatment, he has failed OTC treatment, ready to trial Chantix again.  Discussed side effects, quit date, start date, etc. Rx for Starting and Continuing packs sent to pharmacy.  She will update.

## 2019-09-06 NOTE — Assessment & Plan Note (Signed)
Continued symptoms despite physical therapy. Plain films negative last visit. MRI of lumbar spine placed.  Encouraged continued stretching and home PT exercises.  Await results.

## 2019-09-10 ENCOUNTER — Ambulatory Visit
Admission: RE | Admit: 2019-09-10 | Discharge: 2019-09-10 | Disposition: A | Discharge status: Home / Self Care | Payer: 59 | Source: Ambulatory Visit | Attending: Primary Care | Admitting: Primary Care

## 2019-09-10 ENCOUNTER — Other Ambulatory Visit: Payer: Self-pay

## 2019-09-10 DIAGNOSIS — M5442 Lumbago with sciatica, left side: Secondary | ICD-10-CM

## 2019-10-04 ENCOUNTER — Encounter: Payer: Self-pay | Admitting: Internal Medicine

## 2019-10-04 ENCOUNTER — Ambulatory Visit (INDEPENDENT_AMBULATORY_CARE_PROVIDER_SITE_OTHER): Payer: 59 | Admitting: Internal Medicine

## 2019-10-04 ENCOUNTER — Other Ambulatory Visit: Payer: Self-pay

## 2019-10-04 VITALS — BP 104/64 | HR 78 | Temp 98.0°F | Ht 63.0 in | Wt 108.0 lb

## 2019-10-04 DIAGNOSIS — E0590 Thyrotoxicosis, unspecified without thyrotoxic crisis or storm: Secondary | ICD-10-CM

## 2019-10-04 LAB — CBC WITH DIFFERENTIAL/PLATELET
Basophils Absolute: 0.1 10*3/uL (ref 0.0–0.1)
Basophils Relative: 1.1 % (ref 0.0–3.0)
Eosinophils Absolute: 0.1 10*3/uL (ref 0.0–0.7)
Eosinophils Relative: 2.3 % (ref 0.0–5.0)
HCT: 43.7 % (ref 36.0–46.0)
Hemoglobin: 14.8 g/dL (ref 12.0–15.0)
Lymphocytes Relative: 35 % (ref 12.0–46.0)
Lymphs Abs: 2.3 10*3/uL (ref 0.7–4.0)
MCHC: 33.8 g/dL (ref 30.0–36.0)
MCV: 99.8 fl (ref 78.0–100.0)
Monocytes Absolute: 0.5 10*3/uL (ref 0.1–1.0)
Monocytes Relative: 7.5 % (ref 3.0–12.0)
Neutro Abs: 3.5 10*3/uL (ref 1.4–7.7)
Neutrophils Relative %: 54.1 % (ref 43.0–77.0)
Platelets: 372 10*3/uL (ref 150.0–400.0)
RBC: 4.38 Mil/uL (ref 3.87–5.11)
RDW: 13.1 % (ref 11.5–15.5)
WBC: 6.4 10*3/uL (ref 4.0–10.5)

## 2019-10-04 LAB — COMPREHENSIVE METABOLIC PANEL
ALT: 14 U/L (ref 0–35)
AST: 18 U/L (ref 0–37)
Albumin: 4.5 g/dL (ref 3.5–5.2)
Alkaline Phosphatase: 71 U/L (ref 39–117)
BUN: 8 mg/dL (ref 6–23)
CO2: 28 mEq/L (ref 19–32)
Calcium: 9.4 mg/dL (ref 8.4–10.5)
Chloride: 103 mEq/L (ref 96–112)
Creatinine, Ser: 0.72 mg/dL (ref 0.40–1.20)
GFR: 92.17 mL/min (ref >60.00)
Glucose, Bld: 81 mg/dL (ref 70–99)
Potassium: 4.1 mEq/L (ref 3.5–5.1)
Sodium: 136 mEq/L (ref 135–145)
Total Bilirubin: 0.6 mg/dL (ref 0.2–1.2)
Total Protein: 7.2 g/dL (ref 6.0–8.3)

## 2019-10-04 LAB — T4, FREE: Free T4: 0.66 ng/dL (ref 0.60–1.60)

## 2019-10-04 LAB — TSH: TSH: 0.29 u[IU]/mL — ABNORMAL LOW (ref 0.35–4.50)

## 2019-10-04 NOTE — Patient Instructions (Signed)
-   Please stop by the lab today  

## 2019-10-04 NOTE — Progress Notes (Signed)
Name: Shannon Little  MRN/ DOB: [id number], 1986/01/28    Age/ Sex: 34 y.o., female    PCP: Pleas Koch, NP   Reason for Endocrinology Evaluation: Subclinical hyperthyroidism     Date of Initial Endocrinology Evaluation: 10/05/2019     HPI: Ms. Shannon Little is a 34 y.o. female with a past medical history of GAD. The patient presented for initial endocrinology clinic visit on 10/05/2019 for consultative assistance with her subclinical hyperthyroidism  Pt has been noted to have a low TSH since 2018, with normal FT4, this was initially discovered by her OB after her last baby was born.    Today her weight has been stable  Denies diarrhea  Has occasional palpitations and chronic anxiety  Denies eye burning or itching    Has noted local neck enlargement   No biotin  No prior exposure to radiation   No FH of thyroid disease    She is S/P Tubal ligation in 2019    Has 3 biological  kids and 2 step kids    HISTORY:  Past Medical History:  Past Medical History:  Diagnosis Date  . Cervical dysplasia   . Complication of anesthesia    HARD TO WAKE UP AFTER D & C  . Depression   . GERD (gastroesophageal reflux disease)    H/O WITH PREGNANCY  . Precipitous delivery 10/31/2016   Past Surgical History:  Past Surgical History:  Procedure Laterality Date  . BREAST ENHANCEMENT SURGERY    . DILATION AND CURETTAGE OF UTERUS    . LAPAROSCOPIC TUBAL LIGATION Bilateral 01/21/2017   Procedure: LAPAROSCOPIC TUBAL LIGATION;  Surgeon: Malachy Mood, MD;  Location: [facility] ORS;  Service: Gynecology;  Laterality: Bilateral;  . LEEP        Social History:  reports that she has been smoking. She has a 6.50 pack-year smoking history. She has never used smokeless tobacco. She reports current drug use. Drug: Marijuana. She reports that she does not drink alcohol.  Family History: family history includes COPD in her father; Cancer in her paternal grandmother; Healthy in her  mother.   HOME MEDICATIONS: Allergies as of 10/04/2019   No Known Allergies     Medication List       Accurate as of October 04, 2019 11:59 PM. If you have any questions, ask your nurse or doctor.        cyclobenzaprine 5 MG tablet Commonly known as: FLEXERIL Take 1 tablet (5 mg total) by mouth 3 (three) times daily as needed for muscle spasms.   escitalopram 10 MG tablet Commonly known as: Lexapro Take 1 tablet (10 mg total) by mouth daily. For anxiety and depression   varenicline 1 MG tablet Commonly known as: Chantix Continuing Month Pak Take 1 tablet (1 mg total) by mouth 2 (two) times daily.   Chantix Starting Month Pak 0.5 MG X 11 & 1 MG X 42 tablet Generic drug: varenicline Take one 0.5 mg tablet by mouth once daily for 3 days, then increase to one 0.5 mg tablet twice daily for 4 days, then increase to one 1 mg tablet twice daily.         REVIEW OF SYSTEMS: A comprehensive ROS was conducted with the patient and is negative except as per HPI and below:  Review of Systems  Constitutional: Positive for malaise/fatigue.  Musculoskeletal: Positive for joint pain.  Psychiatric/Behavioral: The patient is nervous/anxious.        OBJECTIVE:  VS: BP 104/64 (BP Location:  Left Arm, Patient Position: Sitting, Cuff Size: Normal)   Pulse 78   Temp 98 F (36.7 C)   Ht 5\' 3"  (1.6 m)   Wt 108 lb (49 kg)   LMP 09/06/2019   SpO2 95%   BMI 19.13 kg/m    Wt Readings from Last 3 Encounters:  10/04/19 108 lb (49 kg)  09/06/19 105 lb (47.6 kg)  08/17/19 107 lb 12 oz (48.9 kg)     EXAM: General: Pt appears well and is in NAD  Eyes: External eye exam normal without stare, lid lag or exophthalmos.  EOM intact.   Neck: General: Supple without adenopathy. Thyroid: Thyroid is prominent but no nodules appreciated.   Lungs: Clear with good BS bilat with no rales, rhonchi, or wheezes  Heart: Auscultation: RRR.  Abdomen: Normoactive bowel sounds, soft, nontender, without  masses or organomegaly palpable  Extremities:  BL LE: No pretibial edema normal ROM and strength.  Skin: Hair: Texture and amount normal with gender appropriate distribution Skin Inspection: No rashes Skin Palpation: Skin temperature, texture, and thickness normal to palpation  Neuro: Cranial nerves: II - XII grossly intact  Motor: Normal strength throughout DTRs: 2+ and symmetric in UE without delay in relaxation phase  Mental Status: Judgment, insight: Intact Orientation: Oriented to time, place, and person Mood and affect: No depression, anxiety, or agitation     DATA REVIEWED: Results for SYNETTA, GOGUE (MRN [id number]) as of 10/05/2019 16:31  Ref. Range 10/04/2019 15:03  Sodium Latest Ref Range: 135 - 145 mEq/L 136  Potassium Latest Ref Range: 3.5 - 5.1 mEq/L 4.1  Chloride Latest Ref Range: 96 - 112 mEq/L 103  CO2 Latest Ref Range: 19 - 32 mEq/L 28  Glucose Latest Ref Range: 70 - 99 mg/dL 81  BUN Latest Ref Range: 6 - 23 mg/dL 8  Creatinine Latest Ref Range: 0.40 - 1.20 mg/dL 0.72  Calcium Latest Ref Range: 8.4 - 10.5 mg/dL 9.4  Alkaline Phosphatase Latest Ref Range: 39 - 117 U/L 71  Albumin Latest Ref Range: 3.5 - 5.2 g/dL 4.5  AST Latest Ref Range: 0 - 37 U/L 18  ALT Latest Ref Range: 0 - 35 U/L 14  Total Protein Latest Ref Range: 6.0 - 8.3 g/dL 7.2  Total Bilirubin Latest Ref Range: 0.2 - 1.2 mg/dL 0.6  GFR Latest Ref Range: >60.00 mL/min 92.17  WBC Latest Ref Range: 4.0 - 10.5 K/uL 6.4  RBC Latest Ref Range: 3.87 - 5.11 Mil/uL 4.38  Hemoglobin Latest Ref Range: 12.0 - 15.0 g/dL 14.8  HCT Latest Ref Range: 36.0 - 46.0 % 43.7  MCV Latest Ref Range: 78.0 - 100.0 fl 99.8  MCHC Latest Ref Range: 30.0 - 36.0 g/dL 33.8  RDW Latest Ref Range: 11.5 - 15.5 % 13.1  Platelets Latest Ref Range: 150.0 - 400.0 K/uL 372.0  Neutrophils Latest Ref Range: 43.0 - 77.0 % 54.1  Lymphocytes Latest Ref Range: 12.0 - 46.0 % 35.0  Monocytes Relative Latest Ref Range: 3.0 - 12.0 % 7.5    Eosinophil Latest Ref Range: 0.0 - 5.0 % 2.3  Basophil Latest Ref Range: 0.0 - 3.0 % 1.1  NEUT# Latest Ref Range: 1.4 - 7.7 K/uL 3.5  Lymphocyte # Latest Ref Range: 0.7 - 4.0 K/uL 2.3  Monocyte # Latest Ref Range: 0.1 - 1.0 K/uL 0.5  Eosinophils Absolute Latest Ref Range: 0.0 - 0.7 K/uL 0.1  Basophils Absolute Latest Ref Range: 0.0 - 0.1 K/uL 0.1  TSH Latest Ref Range: 0.35 - 4.50 uIU/mL  0.29 (L)  Triiodothyronine (T3) Latest Ref Range: 76 - 181 ng/dL 97  T4,Free(Direct) Latest Ref Range: 0.60 - 1.60 ng/dL 0.66      ASSESSMENT/PLAN/RECOMMENDATIONS:   1. Subclinical Hyperthyroidism:  - Pt is clinically euthyroid  - No local neck symptoms  - The causes of subclinical hyperthyroidism are the same as the causes of overt hyperthyroidism, and like overt hyperthyroidism, subclinical hyperthyroidism can be persistent or transient. Common causes of subclinical hyperthyroidism include autonomously functioning thyroid adenomas and multinodular goiters , or Graves' disease , thyroiditis or essay interference.    Most patients with subclinical hyperthyroidism have no clinical manifestations of hyperthyroidism, and those symptoms that are present (eg, tachycardia, tremor, dyspnea on exertion, weight loss) are mild and nonspecific. - Repeat labs continue to show low TSH with normal FT4.  - Awaiting TRAB, if this was unrevealing, will proceed with thyroid  uptake and scan   F/U in 4 months  Signed electronically by: Mack Guise, MD  [facility]  [facility] [address] [place], [place] [address] Phone: [phone] FAX: [phone]   CC: Pleas Koch, NP [employer] [place] [address] Phone: [phone] Fax: [phone]   Return to Endocrinology clinic as below: Future Appointments  Date Time Provider [place]  01/31/2020  3:00 PM Tammey Deeg, Melanie Crazier, MD [facility] None

## 2019-10-06 LAB — TRAB (TSH RECEPTOR BINDING ANTIBODY): TRAB: 3.06 IU/L — ABNORMAL HIGH (ref <=2.00)

## 2019-10-06 LAB — T3: T3, Total: 97 ng/dL (ref 76–181)

## 2019-10-07 ENCOUNTER — Telehealth: Payer: Self-pay | Admitting: Internal Medicine

## 2019-10-07 MED ORDER — METHIMAZOLE 5 MG PO TABS: 5.0000 mg | ORAL_TABLET | Freq: Every day | ORAL | 6 refills | Status: DC

## 2019-10-07 NOTE — Telephone Encounter (Signed)
I have attempted to contact the patient through the phone but the call went directly to her voicemail.  A portal message was sent to the patient.   Her TR AB levels came back elevated, which indicates Graves' disease.  I have recommended starting methimazole at 5 mg daily a prescription will be sent to the pharmacy.   Shannon Nena Jordan, MD  [facility] Endocrinology  [facility] [address] [place], [place] [address] Phone: [phone] FAX: [phone]

## 2019-11-24 ENCOUNTER — Other Ambulatory Visit: Payer: Self-pay

## 2019-11-24 DIAGNOSIS — F411 Generalized anxiety disorder: Secondary | ICD-10-CM

## 2019-11-24 DIAGNOSIS — F331 Major depressive disorder, recurrent, moderate: Secondary | ICD-10-CM

## 2019-11-24 NOTE — Telephone Encounter (Signed)
Last prescribed on 08/17/2019 Last OV (acute ) with Allie Bossier on 09/06/2019 Future OV scheduled on 12/14/2019

## 2019-11-25 MED ORDER — ESCITALOPRAM OXALATE 10 MG PO TABS: 10.0000 mg | ORAL_TABLET | Freq: Every day | ORAL | 0 refills | Status: DC

## 2019-11-25 NOTE — Telephone Encounter (Signed)
Refills sent to pharmacy. 

## 2019-11-26 ENCOUNTER — Other Ambulatory Visit: Payer: Self-pay | Admitting: Primary Care

## 2019-11-26 DIAGNOSIS — Z1159 Encounter for screening for other viral diseases: Secondary | ICD-10-CM

## 2019-11-26 DIAGNOSIS — Z0000 Encounter for general adult medical examination without abnormal findings: Secondary | ICD-10-CM

## 2019-12-09 ENCOUNTER — Other Ambulatory Visit: Payer: Self-pay

## 2019-12-09 ENCOUNTER — Other Ambulatory Visit (INDEPENDENT_AMBULATORY_CARE_PROVIDER_SITE_OTHER): Payer: 59

## 2019-12-09 DIAGNOSIS — Z0000 Encounter for general adult medical examination without abnormal findings: Secondary | ICD-10-CM | POA: Diagnosis not present

## 2019-12-09 DIAGNOSIS — Z1159 Encounter for screening for other viral diseases: Secondary | ICD-10-CM

## 2019-12-09 LAB — LIPID PANEL
Cholesterol: 151 mg/dL (ref 0–200)
HDL: 57.2 mg/dL (ref >39.00)
LDL Cholesterol: 79 mg/dL (ref 0–99)
NonHDL: 94
Total CHOL/HDL Ratio: 3
Triglycerides: 76 mg/dL (ref 0.0–149.0)
VLDL: 15.2 mg/dL (ref 0.0–40.0)

## 2019-12-10 LAB — HEPATITIS C ANTIBODY
Hepatitis C Ab: NONREACTIVE
SIGNAL TO CUT-OFF: 0.01 (ref <1.00)

## 2019-12-14 ENCOUNTER — Encounter: Payer: 59 | Admitting: Primary Care

## 2019-12-15 ENCOUNTER — Other Ambulatory Visit (HOSPITAL_COMMUNITY)
Admission: RE | Admit: 2019-12-15 | Discharge: 2019-12-15 | Disposition: A | Discharge status: Home / Self Care | Payer: 59 | Source: Ambulatory Visit | Attending: Primary Care | Admitting: Primary Care

## 2019-12-15 ENCOUNTER — Encounter: Payer: Self-pay | Admitting: Primary Care

## 2019-12-15 ENCOUNTER — Other Ambulatory Visit: Payer: Self-pay

## 2019-12-15 ENCOUNTER — Ambulatory Visit (INDEPENDENT_AMBULATORY_CARE_PROVIDER_SITE_OTHER): Payer: 59 | Admitting: Primary Care

## 2019-12-15 VITALS — BP 110/64 | HR 69 | Temp 96.5°F | Ht 63.0 in | Wt 111.8 lb

## 2019-12-15 DIAGNOSIS — F411 Generalized anxiety disorder: Secondary | ICD-10-CM

## 2019-12-15 DIAGNOSIS — Z124 Encounter for screening for malignant neoplasm of cervix: Secondary | ICD-10-CM | POA: Diagnosis present

## 2019-12-15 DIAGNOSIS — F331 Major depressive disorder, recurrent, moderate: Secondary | ICD-10-CM | POA: Diagnosis not present

## 2019-12-15 DIAGNOSIS — Z0000 Encounter for general adult medical examination without abnormal findings: Secondary | ICD-10-CM | POA: Diagnosis not present

## 2019-12-15 DIAGNOSIS — G8929 Other chronic pain: Secondary | ICD-10-CM

## 2019-12-15 DIAGNOSIS — Z720 Tobacco use: Secondary | ICD-10-CM

## 2019-12-15 DIAGNOSIS — M5442 Lumbago with sciatica, left side: Secondary | ICD-10-CM

## 2019-12-15 DIAGNOSIS — R7989 Other specified abnormal findings of blood chemistry: Secondary | ICD-10-CM

## 2019-12-15 DIAGNOSIS — B360 Pityriasis versicolor: Secondary | ICD-10-CM

## 2019-12-15 NOTE — Assessment & Plan Note (Addendum)
Compliant to Lexapro 10 mg daily, has never really felt a reduction in anxiety. Never improved with Zoloft in the past.   She has also yet to start methimazole which was prescribed by endocrinology in April 2021 as the pharmacy never received the prescription. She will call her endocrinologists office.   Will wait for her to start methimazole and see if this helps improve symptoms, she will update. Denies SI/HI. Continue Lexapro. Consider switching to duloxetine.

## 2019-12-15 NOTE — Patient Instructions (Signed)
You will be contacted regarding your referral to dermatology.  Please let us know if you have not been contacted within two weeks.   Please call your endocrinologist's office as discussed.  Continue to work on a healthy diet. Ensure you are consuming 64 ounces of water daily.  Please update me regarding your anxiety and depression symptoms after starting the new medication from your endocrinologist.  It was a pleasure to see you today!   Preventive Care 75-87 Years Old, Female Preventive care refers to visits with your health care provider and lifestyle choices that can promote health and wellness. This includes:  A yearly physical exam. This may also be called an annual well check.  Regular dental visits and eye exams.  Immunizations.  Screening for certain conditions.  Healthy lifestyle choices, such as eating a healthy diet, getting regular exercise, not using drugs or products that contain nicotine and tobacco, and limiting alcohol use. What can I expect for my preventive care visit? Physical exam Your health care provider will check your:  Height and weight. This may be used to calculate body mass index (BMI), which tells if you are at a healthy weight.  Heart rate and blood pressure.  Skin for abnormal spots. Counseling Your health care provider may ask you questions about your:  Alcohol, tobacco, and drug use.  Emotional well-being.  Home and relationship well-being.  Sexual activity.  Eating habits.  Work and work Statistician.  Method of birth control.  Menstrual cycle.  Pregnancy history. What immunizations do I need?  Influenza (flu) vaccine  This is recommended every year. Tetanus, diphtheria, and pertussis (Tdap) vaccine  You may need a Td booster every 10 years. Varicella (chickenpox) vaccine  You may need this if you have not been vaccinated. Human papillomavirus (HPV) vaccine  If recommended by your health care provider, you may need  three doses over 6 months. Measles, mumps, and rubella (MMR) vaccine  You may need at least one dose of MMR. You may also need a second dose. Meningococcal conjugate (MenACWY) vaccine  One dose is recommended if you are age 105-21 years and a first-year college student living in a residence hall, or if you have one of several medical conditions. You may also need additional booster doses. Pneumococcal conjugate (PCV13) vaccine  You may need this if you have certain conditions and were not previously vaccinated. Pneumococcal polysaccharide (PPSV23) vaccine  You may need one or two doses if you smoke cigarettes or if you have certain conditions. Hepatitis A vaccine  You may need this if you have certain conditions or if you travel or work in places where you may be exposed to hepatitis A. Hepatitis B vaccine  You may need this if you have certain conditions or if you travel or work in places where you may be exposed to hepatitis B. Haemophilus influenzae type b (Hib) vaccine  You may need this if you have certain conditions. You may receive vaccines as individual doses or as more than one vaccine together in one shot (combination vaccines). Talk with your health care provider about the risks and benefits of combination vaccines. What tests do I need?  Blood tests  Lipid and cholesterol levels. These may be checked every 5 years starting at age 24.  Hepatitis C test.  Hepatitis B test. Screening  Diabetes screening. This is done by checking your blood sugar (glucose) after you have not eaten for a while (fasting).  Sexually transmitted disease (STD) testing.  BRCA-related cancer  screening. This may be done if you have a family history of breast, ovarian, tubal, or peritoneal cancers.  Pelvic exam and Pap test. This may be done every 3 years starting at age 79. Starting at age 14, this may be done every 5 years if you have a Pap test in combination with an HPV test. Talk with your  health care provider about your test results, treatment options, and if necessary, the need for more tests. Follow these instructions at home: Eating and drinking   Eat a diet that includes fresh fruits and vegetables, whole grains, lean protein, and low-fat dairy.  Take vitamin and mineral supplements as recommended by your health care provider.  Do not drink alcohol if: ? Your health care provider tells you not to drink. ? You are pregnant, may be pregnant, or are planning to become pregnant.  If you drink alcohol: ? Limit how much you have to 0-1 drink a day. ? Be aware of how much alcohol is in your drink. In the [place] one drink equals one 12 oz bottle of beer (355 mL), one 5 oz glass of wine (148 mL), or one 1 oz glass of hard liquor (44 mL). Lifestyle  Take daily care of your teeth and gums.  Stay active. Exercise for at least 30 minutes on 5 or more days each week.  Do not use any products that contain nicotine or tobacco, such as cigarettes, e-cigarettes, and chewing tobacco. If you need help quitting, ask your health care provider.  If you are sexually active, practice safe sex. Use a condom or other form of birth control (contraception) in order to prevent pregnancy and STIs (sexually transmitted infections). If you plan to become pregnant, see your health care provider for a preconception visit. What's next?  Visit your health care provider once a year for a well check visit.  Ask your health care provider how often you should have your eyes and teeth checked.  Stay up to date on all vaccines. This information is not intended to replace advice given to you by your health care provider. Make sure you discuss any questions you have with your health care provider. Document Revised: 02/05/2018 Document Reviewed: 02/05/2018 Elsevier Patient Education  2020 [employer].

## 2019-12-15 NOTE — Assessment & Plan Note (Signed)
Stopped taking Chantix after two days, she isn't ready at this time to quit. She has both packs at home, she will consider stopping soon.

## 2019-12-15 NOTE — Assessment & Plan Note (Signed)
Following with endocrinology, never started methimazole that was prescribed in April, pharmacy never received Rx. Discussed today to contact her endocrinologist's office and notify them of this information.

## 2019-12-15 NOTE — Assessment & Plan Note (Signed)
Continues to improve, appears much more comfortable today. Continue to monitor.

## 2019-12-15 NOTE — Assessment & Plan Note (Signed)
Tetanus UTD. Pap smear due, completed today. Encouraged a healthy diet, regular exercise. Exam today benign. Labs reviewed.

## 2019-12-15 NOTE — Progress Notes (Signed)
Subjective:    Patient ID: Shannon Little, female    DOB: 22-Jan-1986, 34 y.o.   MRN: [id number]  HPI  This visit occurred during the SARS-CoV-2 public health emergency.  Safety protocols were in place, including screening questions prior to the visit, additional usage of staff PPE, and extensive cleaning of exam room while observing appropriate contact time as indicated for disinfecting solutions.   Shannon Little is a 34 year old female who presents today for complete physical.  Immunizations: -Tetanus: Completed in 2018 -Influenza: Due this season  -Covid-19: Has not completed  Diet: She endorses a poor diet, she is working on improving.  Exercise: She is exercising, stretching daily   Eye exam: No recent exam Dental exam: No recent exam  Pap Smear: Completed in 2020, ASC-US, HPV detected. Due today.  BP Readings from Last 3 Encounters:  12/15/19 110/64  10/04/19 104/64  09/06/19 100/72      Review of Systems  Constitutional: Negative for unexpected weight change.  HENT: Negative for rhinorrhea.   Respiratory: Negative for cough and shortness of breath.   Cardiovascular: Negative for chest pain.  Gastrointestinal: Negative for constipation and diarrhea.  Genitourinary: Negative for difficulty urinating and menstrual problem.  Musculoskeletal: Positive for back pain. Negative for myalgias.  Skin: Positive for rash.       Chronic tinea versicolor  Allergic/Immunologic: Negative for environmental allergies.  Neurological: Positive for numbness. Negative for dizziness and headaches.  Psychiatric/Behavioral: The patient is nervous/anxious.        Past Medical History:  Diagnosis Date  . Cervical dysplasia   . Complication of anesthesia    HARD TO WAKE UP AFTER D & C  . Depression   . GERD (gastroesophageal reflux disease)    H/O WITH PREGNANCY  . Precipitous delivery 10/31/2016     Social History   Socioeconomic History  . Marital status: Married    Spouse  name: Not on file  . Number of children: Not on file  . Years of education: Not on file  . Highest education level: Not on file  Occupational History  . Not on file  Tobacco Use  . Smoking status: Current Every Day Smoker    Packs/day: 0.50    Years: 13.00    Pack years: 6.50  . Smokeless tobacco: Never Used  Vaping Use  . Vaping Use: Never used  Substance and Sexual Activity  . Alcohol use: No    Alcohol/week: 0.0 standard drinks  . Drug use: Yes    Types: Marijuana    Comment: UDS + FOR MARIJUANA ON 10-31-16  . Sexual activity: Yes  Other Topics Concern  . Not on file  Social History Narrative  . Not on file   Social Determinants of Health   Financial Resource Strain:   . Difficulty of Paying Living Expenses:   Food Insecurity:   . Worried About Charity fundraiser in the Last Year:   . Arboriculturist in the Last Year:   Transportation Needs:   . Film/video editor (Medical):   Marland Kitchen Lack of Transportation (Non-Medical):   Physical Activity:   . Days of Exercise per Week:   . Minutes of Exercise per Session:   Stress:   . Feeling of Stress :   Social Connections:   . Frequency of Communication with Friends and Family:   . Frequency of Social Gatherings with Friends and Family:   . Attends Religious Services:   . Active Member of Clubs  or Organizations:   . Attends Archivist Meetings:   Marland Kitchen Marital Status:   Intimate Partner Violence:   . Fear of Current or Ex-Partner:   . Emotionally Abused:   Marland Kitchen Physically Abused:   . Sexually Abused:     Past Surgical History:  Procedure Laterality Date  . BREAST ENHANCEMENT SURGERY    . DILATION AND CURETTAGE OF UTERUS    . LAPAROSCOPIC TUBAL LIGATION Bilateral 01/21/2017   Procedure: LAPAROSCOPIC TUBAL LIGATION;  Surgeon: Malachy Mood, MD;  Location: [facility] ORS;  Service: Gynecology;  Laterality: Bilateral;  . LEEP      Family History  Problem Relation Age of Onset  . Cancer Paternal Grandmother         ovarian  . Healthy Mother   . COPD Father     No Known Allergies  Current Outpatient Medications on File Prior to Visit  Medication Sig Dispense Refill  . escitalopram (LEXAPRO) 10 MG tablet Take 1 tablet (10 mg total) by mouth daily. For anxiety and depression 90 tablet 0  . methimazole (TAPAZOLE) 5 MG tablet Take 1 tablet (5 mg total) by mouth daily. 30 tablet 6  . varenicline (CHANTIX CONTINUING MONTH PAK) 1 MG tablet Take 1 tablet (1 mg total) by mouth 2 (two) times daily. (Patient not taking: Reported on 12/15/2019) 60 tablet 0  . varenicline (CHANTIX STARTING MONTH PAK) 0.5 MG X 11 & 1 MG X 42 tablet Take one 0.5 mg tablet by mouth once daily for 3 days, then increase to one 0.5 mg tablet twice daily for 4 days, then increase to one 1 mg tablet twice daily. (Patient not taking: Reported on 12/15/2019) 53 tablet 0   No current facility-administered medications on file prior to visit.    BP 110/64   Pulse 69   Temp (!) 96.5 F (35.8 C) (Temporal)   Ht 5\' 3"  (1.6 m)   Wt 111 lb 12 oz (50.7 kg)   LMP 11/21/2019 (Within Days)   SpO2 98%   BMI 19.80 kg/m    Objective:   Physical Exam HENT:     Right Ear: Tympanic membrane and ear canal normal.     Left Ear: Tympanic membrane and ear canal normal.  Eyes:     Pupils: Pupils are equal, round, and reactive to light.  Cardiovascular:     Rate and Rhythm: Normal rate and regular rhythm.  Pulmonary:     Effort: Pulmonary effort is normal.     Breath sounds: Normal breath sounds.  Abdominal:     General: Bowel sounds are normal.     Palpations: Abdomen is soft.     Tenderness: There is no abdominal tenderness.  Genitourinary:    Labia:        Right: No lesion.        Left: No lesion.      Cervix: Discharge present. No cervical motion tenderness, lesion or cervical bleeding.     Adnexa: Right adnexa normal and left adnexa normal.     Comments: Scant amount of whitish discharge, no foul smell. Musculoskeletal:        General:  Normal range of motion.     Cervical back: Neck supple.  Skin:    General: Skin is warm and dry.     Comments: Tinea versicolor noted to bilateral upper extremities, neck line. Also with red bumps to bilateral trunk.   Neurological:     Mental Status: She is alert and oriented to person, place, and  time.     Cranial Nerves: No cranial nerve deficit.     Deep Tendon Reflexes:     Reflex Scores:      Patellar reflexes are 2+ on the right side and 2+ on the left side. Psychiatric:        Mood and Affect: Mood normal.            Assessment & Plan:

## 2019-12-20 LAB — CYTOLOGY - PAP
Comment: NEGATIVE
Comment: NEGATIVE
Diagnosis: NEGATIVE
HPV 16: NEGATIVE
HPV 18 / 45: NEGATIVE
High risk HPV: POSITIVE — AB

## 2020-01-10 ENCOUNTER — Encounter: Payer: Self-pay | Admitting: Primary Care

## 2020-01-31 ENCOUNTER — Ambulatory Visit: Payer: 59 | Admitting: Internal Medicine

## 2020-01-31 NOTE — Progress Notes (Deleted)
Name: Shannon Little  MRN/ DOB: [id number], [birth year]    Age/ Sex: 34 y.o., female     PCP: Pleas Koch, NP   Reason for Endocrinology Evaluation: Hyperthyroidism     Initial Endocrinology Clinic Visit: 10/04/2019    PATIENT IDENTIFIER: Shannon Little is a 34 y.o., female with a past medical history of GAD. She has followed with [facility] since 10/04/2019 for consultative assistance with management of her Subclinical hyperthyroidism.   HISTORICAL SUMMARY:  Pt has been noted to have a low TSH since 2018, with normal FT4, this was initially discovered by her OB after the delivery of her last child. TSH nadir 0.22 uIU/mL. TRAb level was slightly  elevated and pt was started on methimazole 09/2019   No Fh of thyroid disease  SUBJECTIVE:    Today (01/31/2020):  [name] is here for a follow up on Graves' Disease.      HISTORY:  Past Medical History:  Past Medical History:  Diagnosis Date  . Cervical dysplasia   . Complication of anesthesia    HARD TO WAKE UP AFTER D & C  . Depression   . GERD (gastroesophageal reflux disease)    H/O WITH PREGNANCY  . Precipitous delivery 10/31/2016    Past Surgical History:  Past Surgical History:  Procedure Laterality Date  . BREAST ENHANCEMENT SURGERY    . DILATION AND CURETTAGE OF UTERUS    . LAPAROSCOPIC TUBAL LIGATION Bilateral 01/21/2017   Procedure: LAPAROSCOPIC TUBAL LIGATION;  Surgeon: Malachy Mood, MD;  Location: [facility] ORS;  Service: Gynecology;  Laterality: Bilateral;  . LEEP       Social History:  reports that she has been smoking. She has a 6.50 pack-year smoking history. She has never used smokeless tobacco. She reports current drug use. Drug: Marijuana. She reports that she does not drink alcohol. Family History:  Family History  Problem Relation Age of Onset  . Cancer Paternal Grandmother        ovarian  . Healthy Mother   . COPD Father       HOME MEDICATIONS: Allergies as  of 01/31/2020   No Known Allergies     Medication List       Accurate as of January 31, 2020 12:51 PM. If you have any questions, ask your nurse or doctor.        escitalopram 10 MG tablet Commonly known as: Lexapro Take 1 tablet (10 mg total) by mouth daily. For anxiety and depression   methimazole 5 MG tablet Commonly known as: TAPAZOLE Take 1 tablet (5 mg total) by mouth daily.   varenicline 1 MG tablet Commonly known as: Chantix Continuing Month Pak Take 1 tablet (1 mg total) by mouth 2 (two) times daily.   Chantix Starting Month Pak 0.5 MG X 11 & 1 MG X 42 tablet Generic drug: varenicline Take one 0.5 mg tablet by mouth once daily for 3 days, then increase to one 0.5 mg tablet twice daily for 4 days, then increase to one 1 mg tablet twice daily.         OBJECTIVE:   PHYSICAL EXAM: VS: There were no vitals taken for this visit.   EXAM: General: Pt appears well and is in NAD  Neck: General: Supple without adenopathy. Thyroid: Thyroid size normal.  No goiter or nodules appreciated. No thyroid bruit.  Lungs: Clear with good BS bilat with no rales, rhonchi, or wheezes  Heart: Auscultation: RRR.  Abdomen: Normoactive bowel sounds, soft, nontender,  without masses or organomegaly palpable  Extremities:  BL LE: No pretibial edema normal ROM and strength.  Mental Status: Judgment, insight: Intact Orientation: Oriented to time, place, and person Mood and affect: No depression, anxiety, or agitation     DATA REVIEWED: ***   Results for HAIVEN, NARDONE (MRN [id number]) as of 01/31/2020 12:54  Ref. Range 10/04/2019 15:03  TRAB Latest Ref Range: <=2.00 IU/L 3.06 (H)   ASSESSMENT / PLAN / RECOMMENDATIONS:   1. Subclinical hyperthyroidism  Plan:  ***    Medications   ***   Signed electronically by: Mack Guise, MD  [facility]  [address] [place], [place] [address] Phone: [phone] FAX:  [phone]      CC: Pleas Koch, NP [place] [address] Phone: [phone]  Fax: [phone]   Return to Endocrinology clinic as below: Future Appointments  Date Time Provider [place]  01/31/2020  3:00 PM Marijo Quizon, Melanie Crazier, MD [id number] None

## 2020-08-29 ENCOUNTER — Ambulatory Visit: Payer: 59 | Admitting: Primary Care

## 2020-08-29 ENCOUNTER — Other Ambulatory Visit: Payer: Self-pay

## 2020-12-06 IMAGING — DX DG LUMBAR SPINE COMPLETE 4+V
5 series · 5 of 5 positions shown · non-contrast
Comparison: None.

CLINICAL DATA: Acute back pain.  No trauma.  Radiculopathy.

EXAM:
LUMBAR SPINE - COMPLETE 4+ VIEW

[l-spine ap]
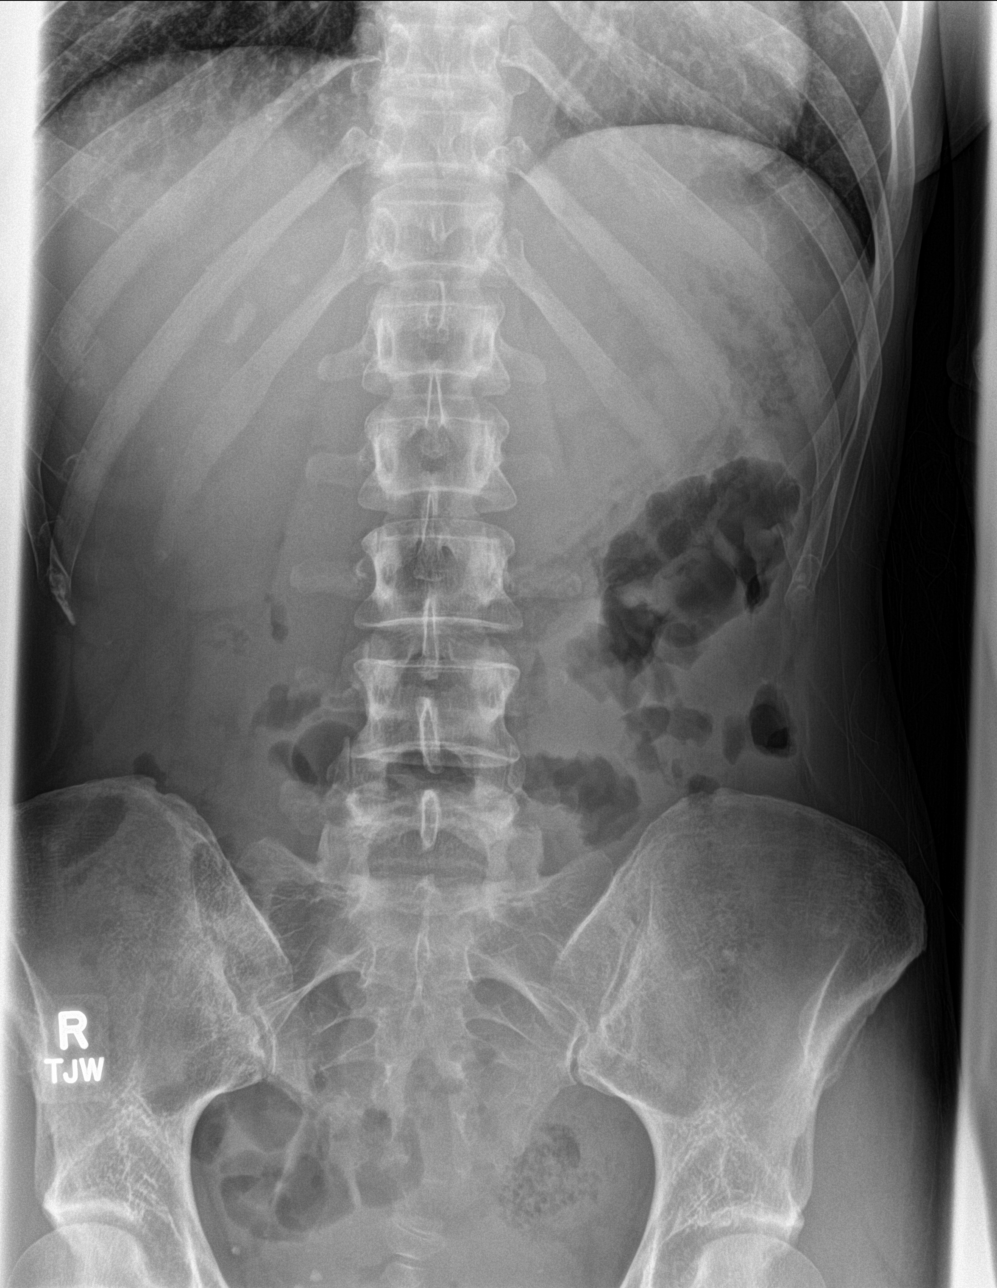

[l-spine obl (1 of 2)]
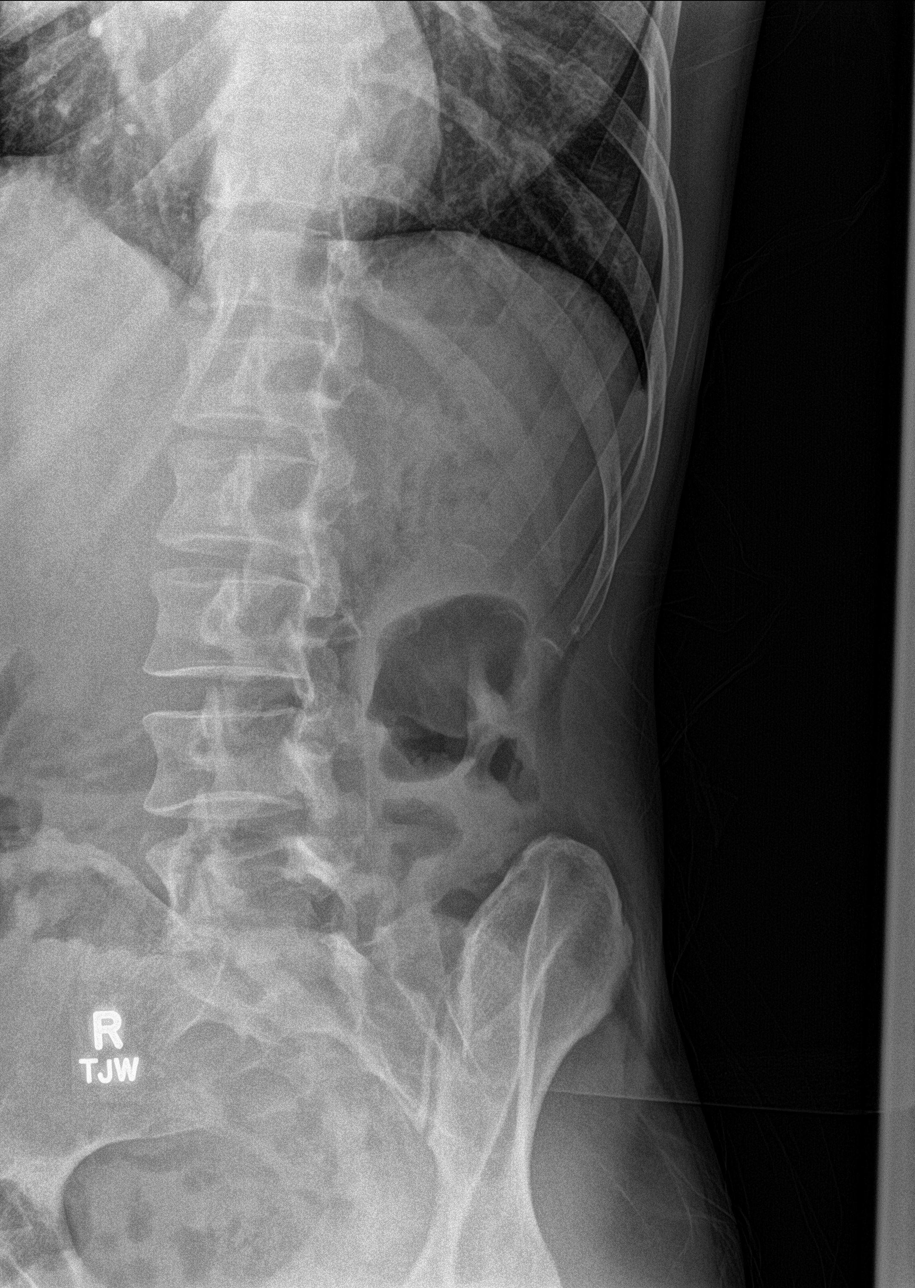

[l-spine obl (2 of 2)]
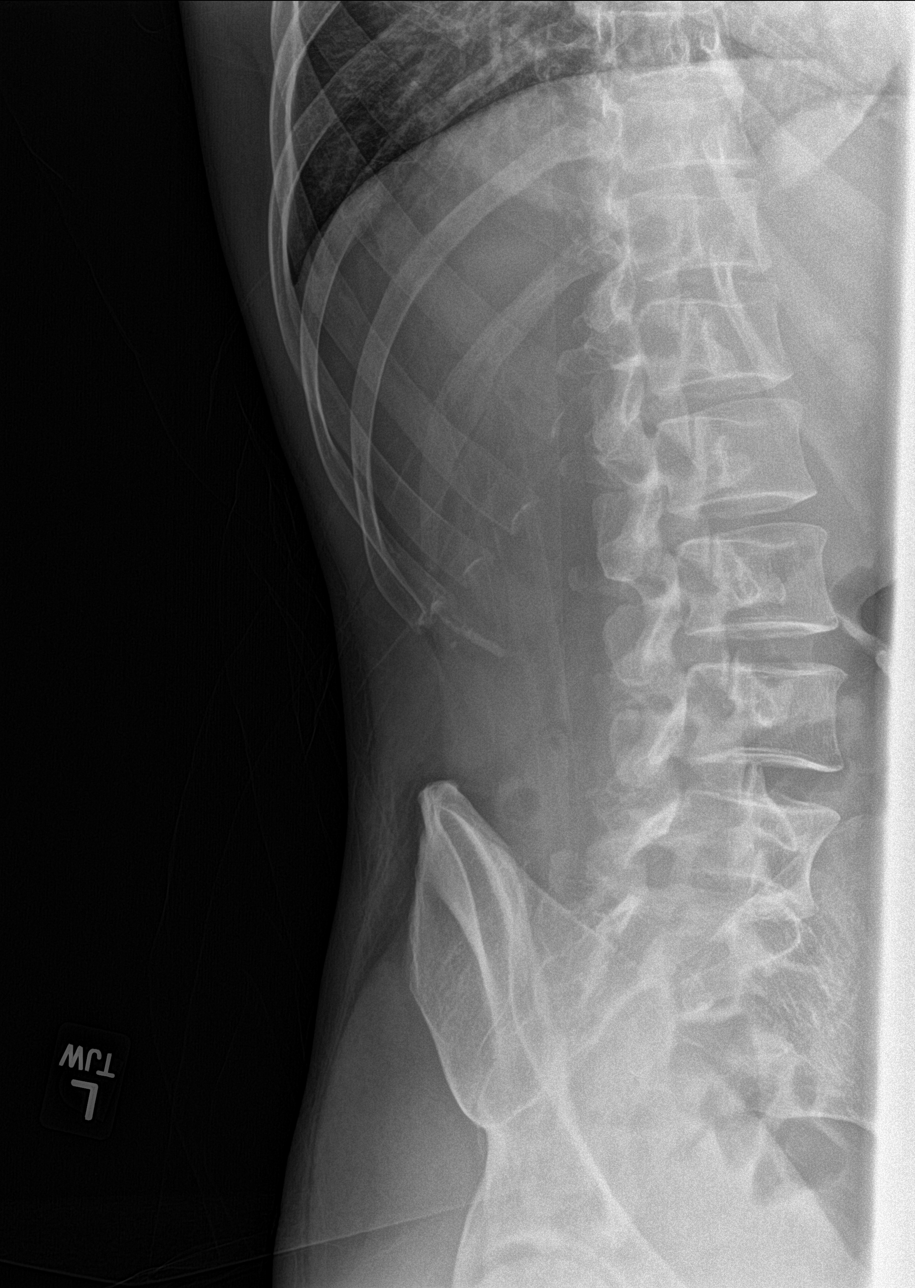

[l-spine lat]
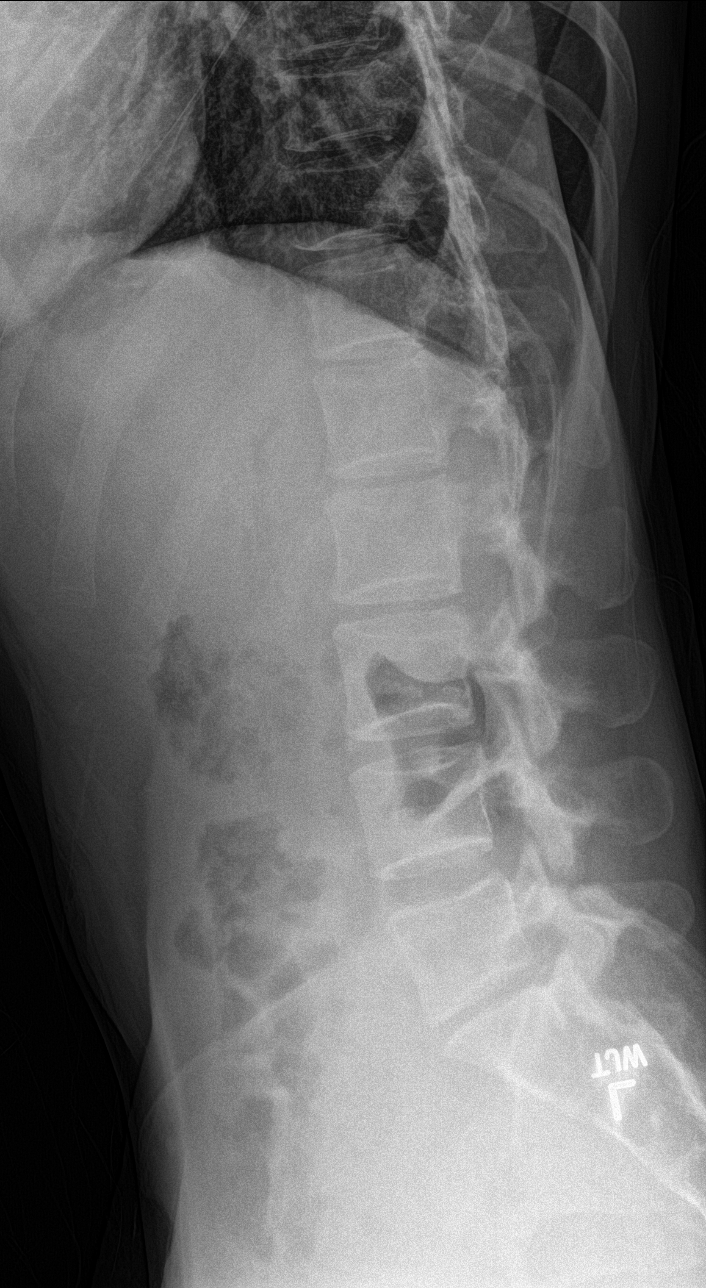

[l-spine l5/s1]
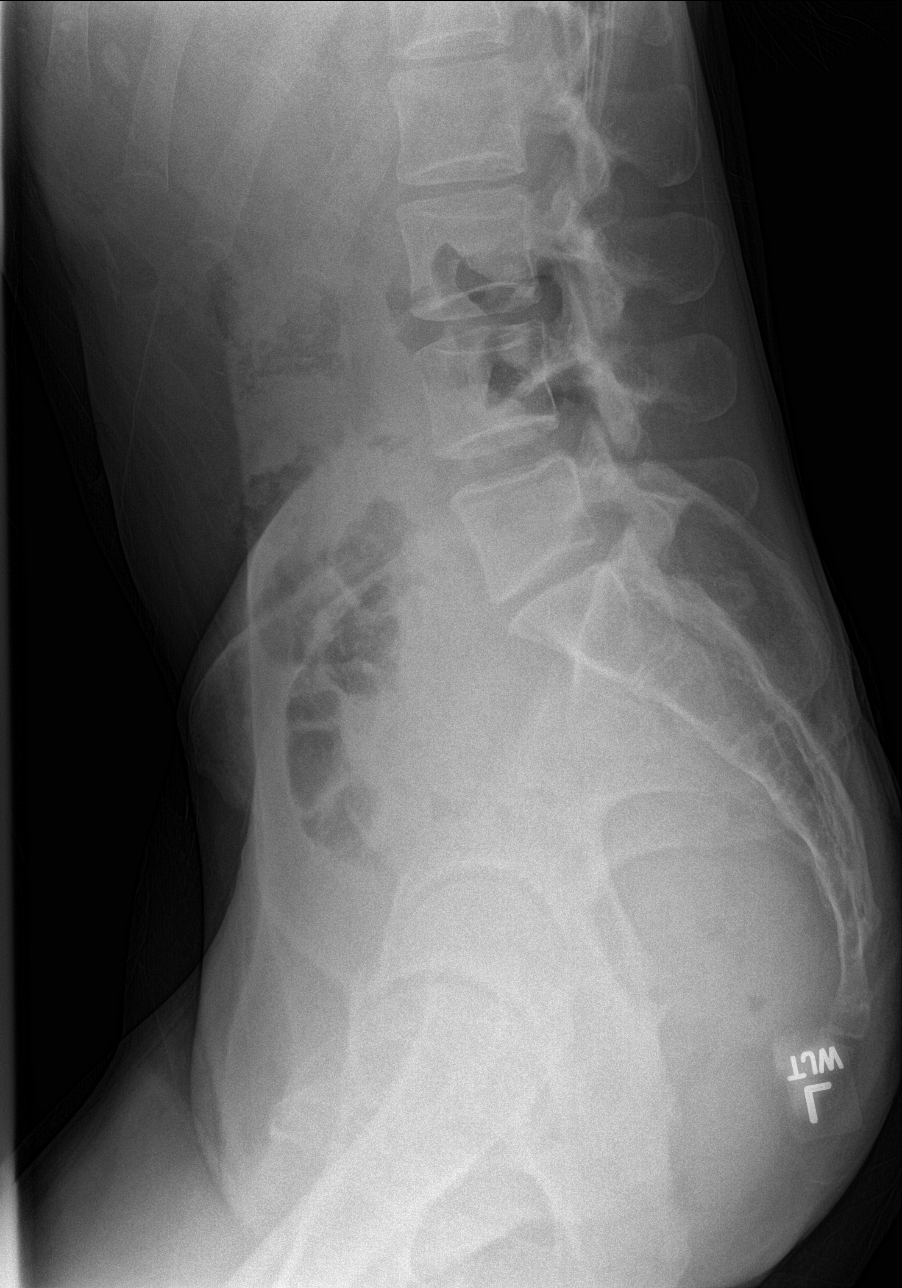

[5 of 5 positions shown; findings below may reference images not displayed]

FINDINGS: Two rounded densities project over the right upper quadrant with the
largest measuring 15 mm. These in sees project over the upper pole
the right kidney.

No malalignment, fracture, or degenerative changes section with the
lumbar spine.
IMPRESSION: 1. No abnormalities in the lumbar spine to explain the patient's
symptoms.
2. Rounded densities, likely calcifications, projected over the
upper pole the right kidney. The findings are most consistent with
nephrolithiasis.

## 2021-03-01 ENCOUNTER — Other Ambulatory Visit: Payer: Self-pay

## 2021-03-01 ENCOUNTER — Ambulatory Visit (INDEPENDENT_AMBULATORY_CARE_PROVIDER_SITE_OTHER): Payer: 59 | Admitting: Primary Care

## 2021-03-01 ENCOUNTER — Encounter: Payer: Self-pay | Admitting: Primary Care

## 2021-03-01 VITALS — BP 110/86 | HR 81 | Temp 98.1°F | Ht 63.0 in | Wt 111.0 lb

## 2021-03-01 DIAGNOSIS — F331 Major depressive disorder, recurrent, moderate: Secondary | ICD-10-CM | POA: Diagnosis not present

## 2021-03-01 DIAGNOSIS — F411 Generalized anxiety disorder: Secondary | ICD-10-CM

## 2021-03-01 DIAGNOSIS — H9203 Otalgia, bilateral: Secondary | ICD-10-CM | POA: Insufficient documentation

## 2021-03-01 MED ORDER — ESCITALOPRAM OXALATE 10 MG PO TABS: 10.0000 mg | ORAL_TABLET | Freq: Every day | ORAL | 0 refills | Status: DC

## 2021-03-01 MED ORDER — FLUTICASONE PROPIONATE 50 MCG/ACT NA SUSP: 1.0000 | Freq: Two times a day (BID) | NASAL | 0 refills | Status: DC

## 2021-03-01 NOTE — Patient Instructions (Signed)
Please schedule an appointment with your endocrinologist.  Start escitalopram  (Lexapro) 10 mg for anxiety and depression. Take 1/2 tablet by mouth once daily for about one week, then increase to 1 full tablet thereafter.   Please contact me if you experience any problems when starting this medication or with the dose increase to 1 full tablet. Common side effects should abate within 1-2 weeks.   Please schedule a follow up visit for 6 weeks for follow up of anxiety/depression.  It was a pleasure to see you today!

## 2021-03-01 NOTE — Progress Notes (Signed)
Subjective:    Patient ID: Shannon Little, female    DOB: 12-11-1985, 35 y.o.   MRN: [id number]  HPI  Shannon Little is a very pleasant 35 y.o. female who presents today to discuss otalgia and to discuss anxiety and depression.  1) Otalgia: Her pain is located to the right ear which began 2-3 months ago. She's noticed a "popping" sensation, pressure when bending forward, pain when laying on the right ear, tenderness behind right ear.  She does notice some popping and pressure to her left ear, not as worse as right.   She's tried OTC ear drops, peroxide, and several home remedies without much improvement. She has not tried nasal sprays.   2) Anxiety and Depression: Chronic, ongoing. Has been off treatment for years. Symptoms include tearfulness, feeling down, feeling anxious, irritable.   She was once managed on Lexapro 10 mg, isn't sure if this was effective as she didn't take it consistently.   BP Readings from Last 3 Encounters:  03/01/21 110/86  12/15/19 110/64  10/04/19 104/64      Review of Systems  HENT:  Positive for ear pain. Negative for congestion, sinus pressure and sore throat.   Respiratory:  Negative for cough.   Psychiatric/Behavioral:  The patient is nervous/anxious.        See HPI        Past Medical History:  Diagnosis Date   Cervical dysplasia    Complication of anesthesia    HARD TO WAKE UP AFTER D & C   Depression    GERD (gastroesophageal reflux disease)    H/O WITH PREGNANCY   Precipitous delivery 10/31/2016    Social History   Socioeconomic History   Marital status: Married    Spouse name: Not on file   Number of children: Not on file   Years of education: Not on file   Highest education level: Not on file  Occupational History   Not on file  Tobacco Use   Smoking status: Every Day    Packs/day: 0.50    Years: 13.00    Pack years: 6.50    Types: Cigarettes   Smokeless tobacco: Never  Vaping Use   Vaping Use: Never used   Substance and Sexual Activity   Alcohol use: No    Alcohol/week: 0.0 standard drinks   Drug use: Yes    Types: Marijuana    Comment: UDS + FOR MARIJUANA ON 10-31-16   Sexual activity: Yes  Other Topics Concern   Not on file  Social History Narrative   Not on file   Social Determinants of Health   Financial Resource Strain: Not on file  Food Insecurity: Not on file  Transportation Needs: Not on file  Physical Activity: Not on file  Stress: Not on file  Social Connections: Not on file  Intimate Partner Violence: Not on file    Past Surgical History:  Procedure Laterality Date   [place] Bilateral 01/21/2017   Procedure: LAPAROSCOPIC TUBAL LIGATION;  Surgeon: Malachy Mood, MD;  Location: [facility] ORS;  Service: Gynecology;  Laterality: Bilateral;   LEEP      Family History  Problem Relation Age of Onset   Cancer Paternal Grandmother        ovarian   Healthy Mother    COPD Father     No Known Allergies  No current outpatient medications on file prior to visit.  No current facility-administered medications on file prior to visit.    BP 110/86   Pulse 81   Temp 98.1 F (36.7 C) (Temporal)   Ht 5\' 3"  (1.6 m)   Wt 111 lb (50.3 kg)   SpO2 97%   BMI 19.66 kg/m  Objective:   Physical Exam HENT:     Right Ear: Tympanic membrane and ear canal normal. Tympanic membrane is not erythematous or bulging.     Left Ear: Tympanic membrane and ear canal normal. Tympanic membrane is not erythematous or bulging.  Cardiovascular:     Rate and Rhythm: Normal rate and regular rhythm.  Pulmonary:     Effort: Pulmonary effort is normal.     Breath sounds: Normal breath sounds.  Musculoskeletal:     Cervical back: Neck supple.  Skin:    General: Skin is warm and dry.  Psychiatric:     Comments: Tearful during visit at times          Assessment & Plan:      This visit occurred  during the SARS-CoV-2 public health emergency.  Safety protocols were in place, including screening questions prior to the visit, additional usage of staff PPE, and extensive cleaning of exam room while observing appropriate contact time as indicated for disinfecting solutions.

## 2021-03-01 NOTE — Assessment & Plan Note (Signed)
No infection, foreign body, any abnormality.  Trial of Flonase sent to pharmacy. She will update.

## 2021-03-01 NOTE — Assessment & Plan Note (Signed)
Patient admits to non compliance with Lexapro previously.  Ready to try treatment again.  Also suggested that she get back to her endocrinologist for thyroid evaluation.   Rx for Lexapro 10 mg sent to pharmacy. Patient is to take 1/2 tablet daily for 8 days, then advance to 1 full tablet thereafter. We discussed possible side effects of headache, GI upset, drowsiness, and SI/HI. If thoughts of SI/HI develop, we discussed to present to the emergency immediately. Patient verbalized understanding.   Follow up in 6 weeks.

## 2021-04-12 ENCOUNTER — Telehealth: Payer: 59 | Admitting: Primary Care

## 2021-04-12 ENCOUNTER — Other Ambulatory Visit: Payer: Self-pay

## 2021-05-15 ENCOUNTER — Ambulatory Visit
Admission: EM | Admit: 2021-05-15 | Discharge: 2021-05-15 | Disposition: A | Discharge status: Home / Self Care | Payer: 59 | Attending: Family Medicine | Admitting: Family Medicine

## 2021-05-15 ENCOUNTER — Encounter: Payer: Self-pay | Admitting: Emergency Medicine

## 2021-05-15 DIAGNOSIS — S46912A Strain of unspecified muscle, fascia and tendon at shoulder and upper arm level, left arm, initial encounter: Secondary | ICD-10-CM | POA: Diagnosis not present

## 2021-05-15 NOTE — ED Triage Notes (Signed)
Pt c/o chest pain near left side of shoulder blade that started yesterday. Pt states it hurts to take a breath in or cough. She also feels nauseous and hands are clammy.

## 2021-05-15 NOTE — ED Provider Notes (Signed)
[facility]    CSN: [id number] Arrival date & time: 05/15/21  1418      History   Chief Complaint Chief Complaint  Patient presents with   Chest Pain    HPI Shannon Little is a 35 y.o. female.   HPI Patient presents today with left sided cheat and shoulder pain. No URI symptoms. No diaphoresis, shortness of breath although is having pain with deep breathing. She also complains of feeling nauseous and kind of clammy since the symptoms developed x1 day.  She is afebrile Past Medical History:  Diagnosis Date   Cervical dysplasia    Complication of anesthesia    HARD TO WAKE UP AFTER D & C   Depression    GERD (gastroesophageal reflux disease)    H/O WITH PREGNANCY   Precipitous delivery 10/31/2016    Patient Active Problem List   Diagnosis Date Noted   Otalgia of both ears 03/01/2021   Preventative health care 12/15/2019   Tobacco abuse 09/06/2019   Moderate episode of recurrent major depressive disorder ([place]) 08/17/2019   GAD (generalized anxiety disorder) 08/17/2019   Chronic back pain 08/17/2019   Abnormal TSH 08/17/2019    Past Surgical History:  Procedure Laterality Date   BREAST ENHANCEMENT SURGERY     DILATION AND CURETTAGE OF UTERUS     LAPAROSCOPIC TUBAL LIGATION Bilateral 01/21/2017   Procedure: LAPAROSCOPIC TUBAL LIGATION;  Surgeon: Malachy Mood, MD;  Location: [facility] ORS;  Service: Gynecology;  Laterality: Bilateral;   LEEP      OB History     Gravida  5   Para  3   Term  3   Preterm  0   AB  2   Living  3      SAB  2   IAB  0   Ectopic  0   Multiple  0   Live Births  3            Home Medications    Prior to Admission medications   Medication Sig Start Date End Date Taking? Authorizing Provider  escitalopram (LEXAPRO) 10 MG tablet Take 1 tablet (10 mg total) by mouth daily. For anxiety and depression. 03/01/21  Yes Pleas Koch, NP  fluticasone (FLONASE) 50 MCG/ACT nasal spray Place 1 spray into both  nostrils 2 (two) times daily. 03/01/21  Yes Pleas Koch, NP    Family History Family History  Problem Relation Age of Onset   Cancer Paternal Grandmother        ovarian   Healthy Mother    COPD Father     Social History Social History   Tobacco Use   Smoking status: Every Day    Packs/day: 0.50    Years: 13.00    Pack years: 6.50    Types: Cigarettes   Smokeless tobacco: Never  Vaping Use   Vaping Use: Never used  Substance Use Topics   Alcohol use: No    Alcohol/week: 0.0 standard drinks   Drug use: Yes    Types: Marijuana    Comment: UDS + FOR MARIJUANA ON 10-31-16     Allergies   Patient has no known allergies.   Review of Systems Review of Systems Pertinent negatives listed in HPI  Physical Exam Triage Vital Signs ED Triage Vitals  Enc Vitals Group     BP 05/15/21 1455 118/78     Pulse Rate 05/15/21 1455 70     Resp 05/15/21 1455 18     Temp  05/15/21 1455 98.2 F (36.8 C)     Temp src --      SpO2 05/15/21 1455 98 %     Weight --      Height --      Head Circumference --      Peak Flow --      Pain Score 05/15/21 1454 5     Pain Loc --      Pain Edu? --      Excl. in [place]? --    No data found.  Updated Vital Signs BP 118/78 (BP Location: Left Arm)   Pulse 70   Temp 98.2 F (36.8 C)   Resp 18   LMP  (LMP Unknown)   SpO2 98%   Visual Acuity Right Eye Distance:   Left Eye Distance:   Bilateral Distance:    Right Eye Near:   Left Eye Near:    Bilateral Near:     Physical Exam Constitutional:      Appearance: Normal appearance.  Cardiovascular:     Rate and Rhythm: Normal rate and regular rhythm.  Pulmonary:     Effort: Pulmonary effort is normal.     Breath sounds: Normal breath sounds and air entry.  Chest:     Chest wall: Tenderness present.  Neurological:     Mental Status: She is alert.  Psychiatric:        Attention and Perception: Attention normal.        Mood and Affect: Mood is anxious.        Speech: Speech  normal.     UC Treatments / Results  Labs (all labs ordered are listed, but only abnormal results are displayed) Labs Reviewed - No data to display  EKG NSR bpm 65, no ST changes.  Radiology No results found.  Procedures Procedures (including critical care time)  Medications Ordered in UC Medications - No data to display  Initial Impression / Assessment and Plan / UC Course  I have reviewed the triage vital signs and the nursing notes.  Pertinent labs & imaging results that were available during my care of the patient were reviewed by me and considered in my medical decision making (see chart for details).    Left shoulder strain  Reproducible tenderness suspect strain of the left shoulder with pain suspect left shoulder strain recommend continuing ibuprofen or Tylenol for management of pain.  Also recommended over-the-counter lidocaine patches also helpful to alleviate MSK pain.  Patient reports she does not like taking medication recommended several topical options that she can purchase over-the-counter.  Return as needed. Final Clinical Impressions(s) / UC Diagnoses   Final diagnoses:  Strain of left shoulder, initial encounter   Discharge Instructions   None    ED Prescriptions   None    PDMP not reviewed this encounter.   Scot Jun, FNP 05/18/21 1556

## 2021-10-26 ENCOUNTER — Ambulatory Visit: Payer: 59 | Admitting: Primary Care

## 2021-10-26 ENCOUNTER — Encounter: Payer: Self-pay | Admitting: Primary Care

## 2021-10-26 VITALS — BP 126/82 | HR 68 | Temp 98.6°F | Ht 63.0 in | Wt 112.0 lb

## 2021-10-26 DIAGNOSIS — H9203 Otalgia, bilateral: Secondary | ICD-10-CM

## 2021-10-26 DIAGNOSIS — Z720 Tobacco use: Secondary | ICD-10-CM | POA: Diagnosis not present

## 2021-10-26 DIAGNOSIS — E0590 Thyrotoxicosis, unspecified without thyrotoxic crisis or storm: Secondary | ICD-10-CM | POA: Diagnosis not present

## 2021-10-26 LAB — TSH: TSH: 0.19 u[IU]/mL — ABNORMAL LOW (ref 0.35–5.50)

## 2021-10-26 LAB — T4, FREE: Free T4: 1.01 ng/dL (ref 0.60–1.60)

## 2021-10-26 NOTE — Assessment & Plan Note (Signed)
No improvement.  Referral placed to ENT.  Discussed use of antihistamine and Flonase.

## 2021-10-26 NOTE — Assessment & Plan Note (Signed)
Strongly encouraged she work on quitting, she is thinking about this.

## 2021-10-26 NOTE — Progress Notes (Signed)
Subjective:    Patient ID: Shannon Little, female    DOB: Dec 19, 1985, 36 y.o.   MRN: [id number]  HPI  Geroldine Esquivias is a very pleasant 36 y.o. female with a history of tobacco abuse, MDD, GAD who presents today to discuss cough and hyperthyroidism.   1) Cough/Chronic Ear Pain: Symptom onset one month ago. She also reports voice hoarseness, postnasal drip, increased cough, chronic bilateral ear pain, chronic chest congestion. Overall these symptoms have nearly resolved.   Her ear pain began >6 months ago, mostly to the right side, sometimes to the left. Pain is noticeable only with certain movements and with touch.   She denies fevers, chills, body aches, personal history of asthma.   She's tried ear drops, cough drops, throat spray with temporary improvement.   She does have a chronic cough, smoker for 20 years, is researching ways to stop smoking.   2) Hyperthyroidism: Previously followed by endocrinology, only went for 1 office visit. Prescribed methimazole 5 mg daily for which she did not take consistently. She has not seen endocrinology or taken treatment in over 1 year.   She is ready to see endocrinology again. She continues to notice anxiety, palpitations, feeling anxious.    Review of Systems  Constitutional:  Negative for chills, fatigue and fever.  HENT:  Positive for postnasal drip and voice change.   Respiratory:  Positive for cough. Negative for chest tightness, shortness of breath and wheezing.   Cardiovascular:  Positive for palpitations.  Psychiatric/Behavioral:  The patient is nervous/anxious.         Past Medical History:  Diagnosis Date   Cervical dysplasia    Complication of anesthesia    HARD TO WAKE UP AFTER D & C   Depression    GERD (gastroesophageal reflux disease)    H/O WITH PREGNANCY   Precipitous delivery 10/31/2016    Social History   Socioeconomic History   Marital status: Married    Spouse name: Not on file   Number of children:  Not on file   Years of education: Not on file   Highest education level: Not on file  Occupational History   Not on file  Tobacco Use   Smoking status: Every Day    Packs/day: 0.50    Years: 13.00    Pack years: 6.50    Types: Cigarettes   Smokeless tobacco: Never  Vaping Use   Vaping Use: Never used  Substance and Sexual Activity   Alcohol use: No    Alcohol/week: 0.0 standard drinks   Drug use: Yes    Types: Marijuana    Comment: UDS + FOR MARIJUANA ON 10-31-16   Sexual activity: Yes  Other Topics Concern   Not on file  Social History Narrative   Not on file   Social Determinants of Health   Financial Resource Strain: Not on file  Food Insecurity: Not on file  Transportation Needs: Not on file  Physical Activity: Not on file  Stress: Not on file  Social Connections: Not on file  Intimate Partner Violence: Not on file    Past Surgical History:  Procedure Laterality Date   [place] Bilateral 01/21/2017   Procedure: LAPAROSCOPIC TUBAL LIGATION;  Surgeon: Malachy Mood, MD;  Location: [facility]S;  Service: Gynecology;  Laterality: Bilateral;   LEEP      Family History  Problem Relation Age of Onset  Cancer Paternal Grandmother        ovarian   Healthy Mother    COPD Father     No Known Allergies  No current outpatient medications on file prior to visit.   No current facility-administered medications on file prior to visit.    BP 126/82   Pulse 68   Temp 98.6 F (37 C) (Oral)   Ht '5\' 3"'$  (1.6 m)   Wt 112 lb (50.8 kg)   SpO2 98%   BMI 19.84 kg/m  Objective:   Physical Exam HENT:     Right Ear: Tympanic membrane and ear canal normal.     Left Ear: Tympanic membrane and ear canal normal.     Nose:     Right Sinus: No maxillary sinus tenderness or frontal sinus tenderness.     Left Sinus: No maxillary sinus tenderness or frontal sinus tenderness.  Eyes:      Conjunctiva/sclera: Conjunctivae normal.  Cardiovascular:     Rate and Rhythm: Normal rate and regular rhythm.  Pulmonary:     Effort: Pulmonary effort is normal.     Breath sounds: Normal breath sounds. No wheezing or rales.  Musculoskeletal:     Cervical back: Neck supple.  Lymphadenopathy:     Cervical: No cervical adenopathy.  Skin:    General: Skin is warm and dry.          Assessment & Plan:      This visit occurred during the SARS-CoV-2 public health emergency.  Safety protocols were in place, including screening questions prior to the visit, additional usage of staff PPE, and extensive cleaning of exam room while observing appropriate contact time as indicated for disinfecting solutions.

## 2021-10-26 NOTE — Assessment & Plan Note (Signed)
No treatment in over 1 year.  Referral placed for endocrinology. TSH and Free T4 pending today.

## 2021-10-26 NOTE — Patient Instructions (Signed)
Stop by the lab prior to leaving today. I will notify you of your results once received.   You will be contacted regarding your referral to endocrinology.  Please let us know if you have not been contacted within two weeks.   Start Zyrtec at bedtime for throat drainage and voice hoarseness.   It was a pleasure to see you today!

## 2021-10-30 ENCOUNTER — Encounter: Payer: Self-pay | Admitting: *Deleted

## 2021-11-09 ENCOUNTER — Encounter: Payer: Self-pay | Admitting: *Deleted

## 2021-12-07 ENCOUNTER — Encounter: Payer: 59 | Admitting: Primary Care
